# Patient Record
Sex: Female | Born: 1985 | Race: White | Hispanic: No | Marital: Married | Smoking: Never smoker
Health system: Southern US, Community
[De-identification: ages and names within clinical notes are randomized; demographics above are authoritative.]

## PROBLEM LIST (undated history)

## (undated) ENCOUNTER — Inpatient Hospital Stay (HOSPITAL_COMMUNITY): Payer: Self-pay

## (undated) DIAGNOSIS — O139 Gestational [pregnancy-induced] hypertension without significant proteinuria, unspecified trimester: Secondary | ICD-10-CM

---

## 2012-05-06 ENCOUNTER — Encounter: Payer: Self-pay | Admitting: Unknown Physician Specialty

## 2012-05-06 LAB — US OB COMP LESS 14 WKS

## 2012-05-23 ENCOUNTER — Encounter: Payer: Self-pay | Admitting: Physician Assistant

## 2012-05-23 ENCOUNTER — Other Ambulatory Visit (HOSPITAL_COMMUNITY): Payer: Self-pay | Admitting: Physician Assistant

## 2012-05-23 DIAGNOSIS — IMO0001 Reserved for inherently not codable concepts without codable children: Secondary | ICD-10-CM

## 2012-05-23 DIAGNOSIS — R9389 Abnormal findings on diagnostic imaging of other specified body structures: Secondary | ICD-10-CM

## 2012-05-27 ENCOUNTER — Other Ambulatory Visit: Payer: Self-pay

## 2012-05-28 ENCOUNTER — Ambulatory Visit (HOSPITAL_COMMUNITY)
Admission: RE | Admit: 2012-05-28 | Discharge: 2012-05-28 | Disposition: A | Discharge status: Home / Self Care | Payer: PRIVATE HEALTH INSURANCE | Source: Ambulatory Visit | Attending: Physician Assistant | Admitting: Physician Assistant

## 2012-05-28 ENCOUNTER — Encounter (HOSPITAL_COMMUNITY): Payer: Self-pay

## 2012-05-28 DIAGNOSIS — R9389 Abnormal findings on diagnostic imaging of other specified body structures: Secondary | ICD-10-CM

## 2012-05-28 DIAGNOSIS — IMO0001 Reserved for inherently not codable concepts without codable children: Secondary | ICD-10-CM

## 2012-05-28 DIAGNOSIS — O358XX0 Maternal care for other (suspected) fetal abnormality and damage, not applicable or unspecified: Secondary | ICD-10-CM | POA: Insufficient documentation

## 2012-05-28 DIAGNOSIS — O30009 Twin pregnancy, unspecified number of placenta and unspecified number of amniotic sacs, unspecified trimester: Secondary | ICD-10-CM | POA: Insufficient documentation

## 2012-05-28 IMAGING — US US OB COMP LESS 14 WK
1 series · 12 of 28 positions shown · non-contrast
Comparison: none

[Series 1: us ob comp less 14 wk · 0.21mm/px · 12 of 49 slices shown]
[im 2/49]
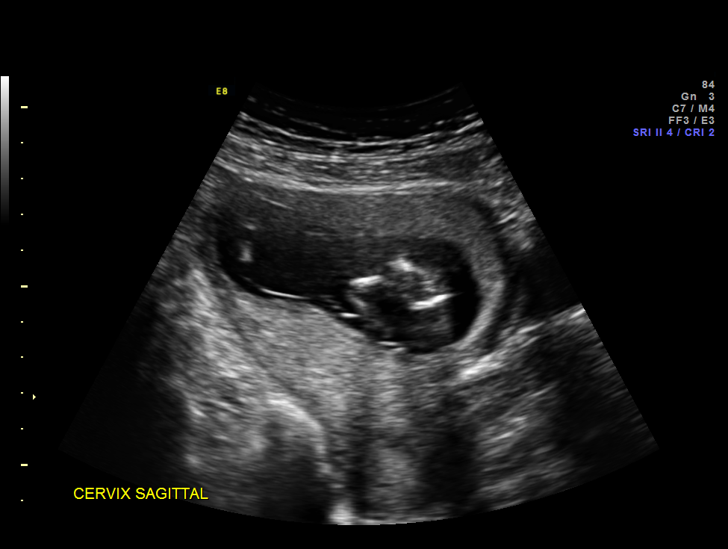
[im 6/49]
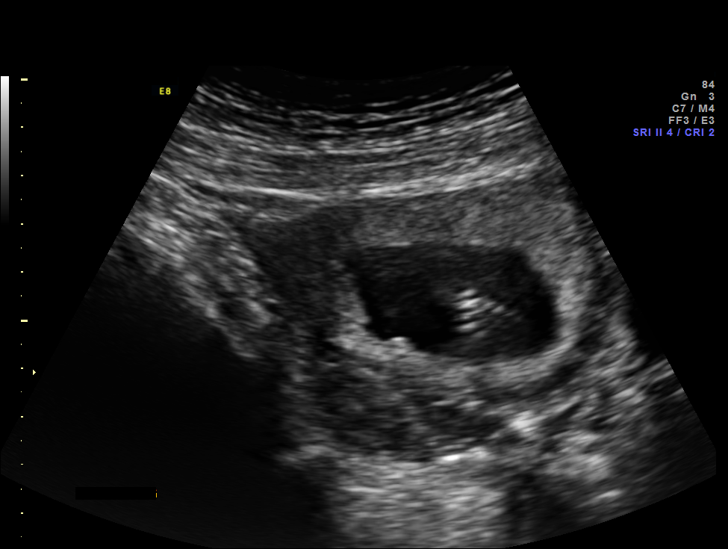
[im 9/49]
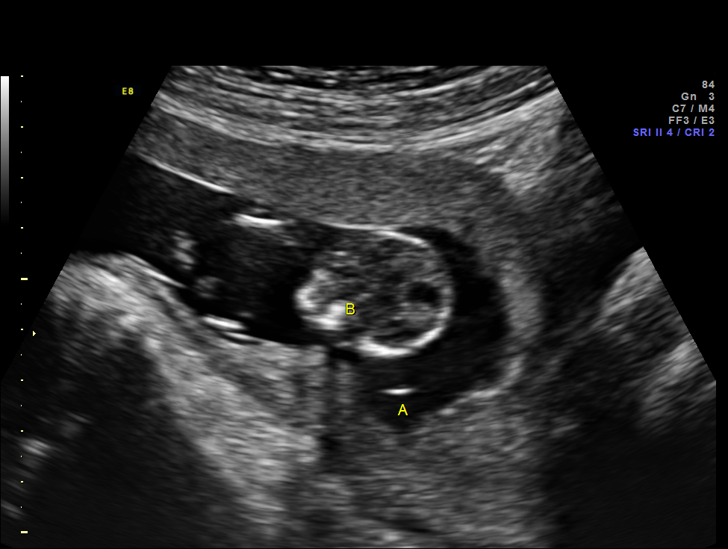
[im 15/49]
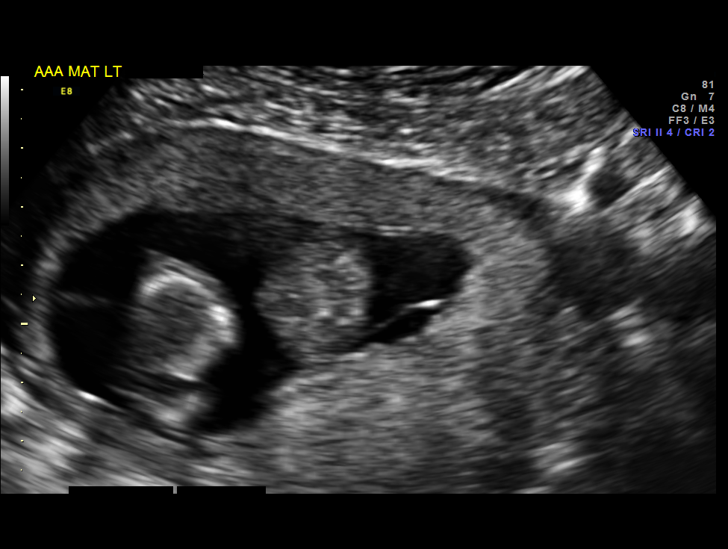
[im 18/49]
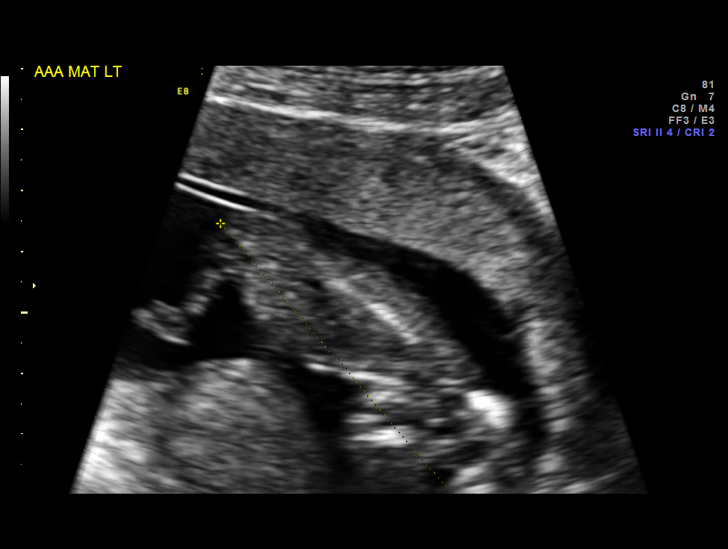
[im 22/49]
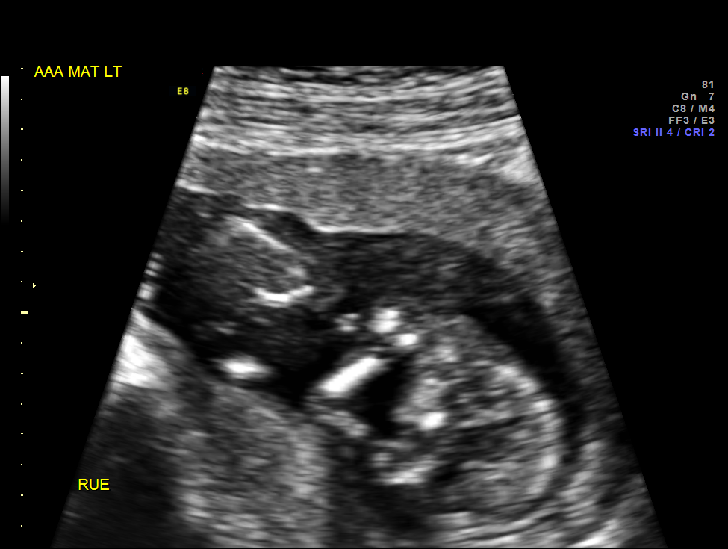
[im 27/49]
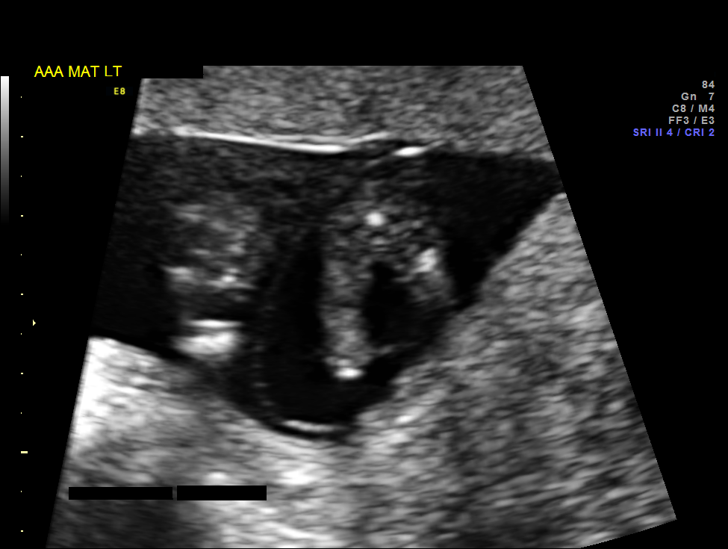
[im 31/49]
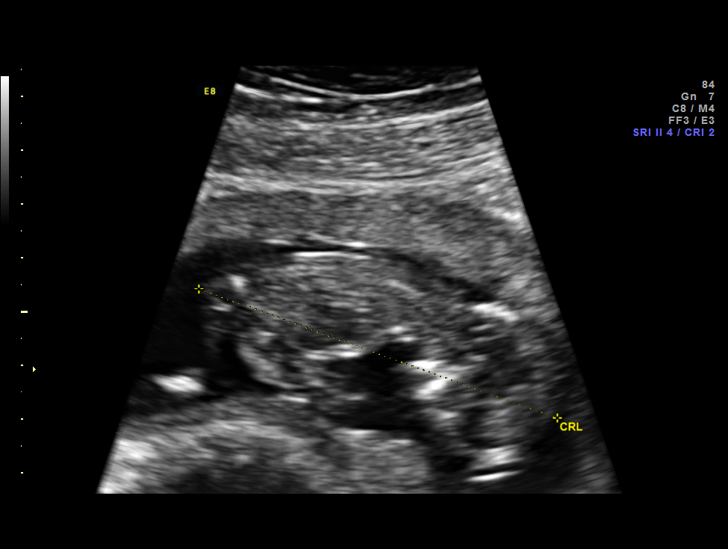
[im 34/49]
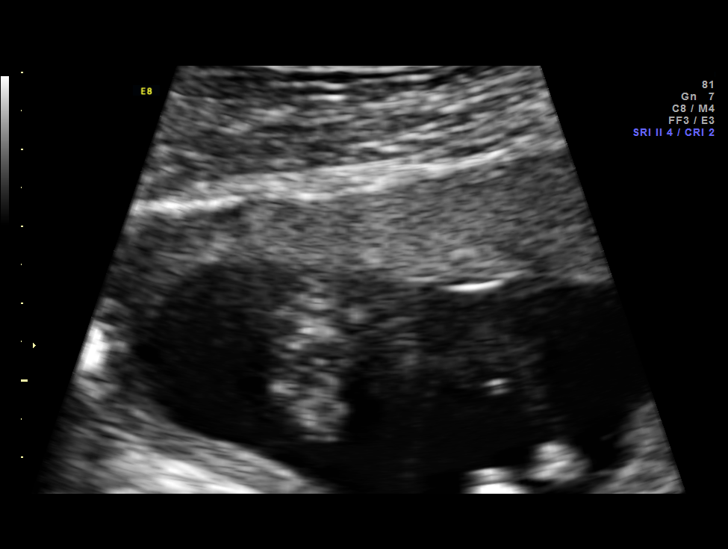
[im 40/49]
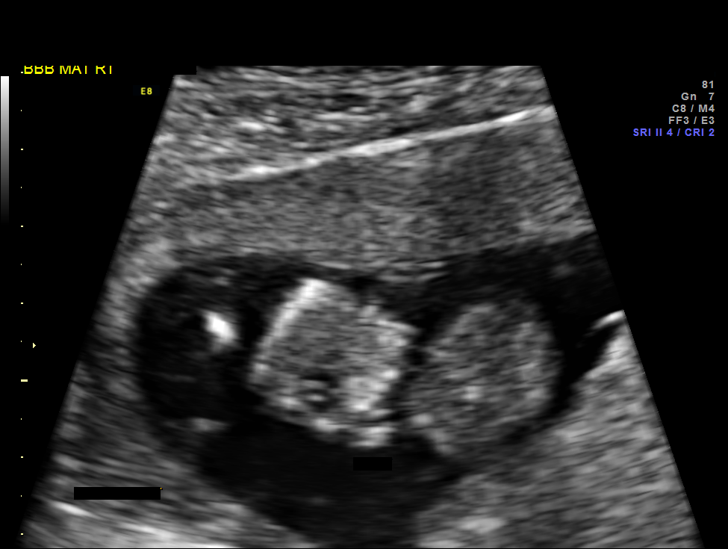
[im 43/49]
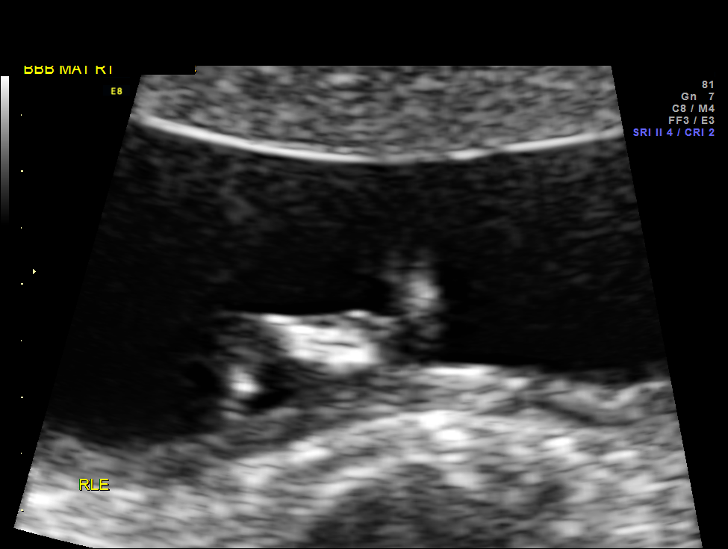
[im 47/49]
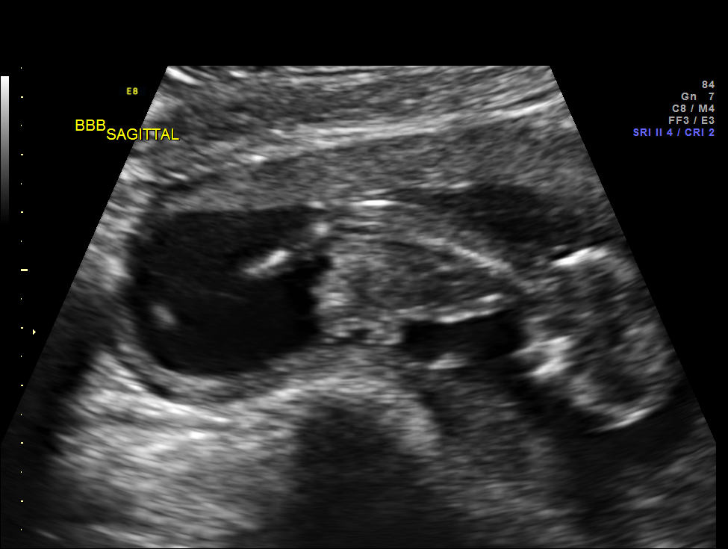

[12 of 28 positions shown; findings below may reference images not displayed]

OBSTETRICS REPORT
                      (Signed Final [DATE] [DATE])

 Order#:         [PHONE_NUMBER]_O,[IG]
                 18_O
Procedures

 US OB COMP LESS 14 WKS                                76801.0
 US OB COMP ADDL GEST LESS 14 WKS                      76802.0
Indications

 Fetal abnormality - other known or suspected
 (question gastroschisis)
 Twin gestation, FG
Fetal Evaluation (Fetus A)

 Preg. Location:    Intrauterine
 Fetal Heart Rate:  157                          bpm
 Cardiac Activity:  Observed
 Fetal Lie:         Maternal left side
 Presentation:      Cephalic
 Placenta:          Anterior, above cervical os
 P. Cord            Eccentric, not marginal
 Insertion:

 Membrane Desc:     Dividing
                    Membrane seen
                    - Monochorionic

 Amniotic Fluid
 AFI FV:      Subjectively within normal limits
Biometry (Fetus A)

 CRL:     68.7  mm     G. Age:  13w 0d                 EDD:    [DATE]
Gestational Age (Fetus A)

 LMP:           13w 0d        Date:  [DATE]                 EDD:   [DATE]
 Best:          13w 0d     Det. By:  LMP  ([DATE])          EDD:   [DATE]
Anatomy (Fetus A)

 Heart:             Situs and axis      Bladder:           Appears normal
                    appear normal
 Stomach:           Appears             Limbs:             Four extremities
                    normal, left                           seen
                    sided
 Abdominal Wall:    Appears nml
                    (cord insert,
                    abd wall)

 Other:     Fetus appears to be a female.

Fetal Evaluation (Fetus B)

 Fetal Heart Rate:  159                          bpm
 Cardiac Activity:  Observed
 Fetal Lie:         Maternal right side
 Presentation:      Cephalic
 Placenta:          Anterior, above cervical os
 P. Cord            Eccentric, not marginal
 Insertion:

 Membrane Desc:     Dividing
                    Membrane seen
                    - Monochorionic

 Amniotic Fluid
 AFI FV:      Subjectively within normal limits
Biometry (Fetus B)

 CRL:     70.7  mm     G. Age:  13w 0d                 EDD:    [DATE]
Gestational Age (Fetus B)

 LMP:           13w 0d        Date:  [DATE]                 EDD:   [DATE]
 Best:          13w 0d     Det. By:  LMP  ([DATE])          EDD:   [DATE]
Anatomy (Fetus B)

 Heart:             Situs and axis      Cord Vessels:      Appears normal
                    appear normal                          (3 vessel cord)
 Stomach:           Appears             Bladder:           Appears normal
                    normal, left
                    sided
 Abdominal Wall:    Gastroschisis       Limbs:             Four extremities
                                                           seen

 Other:     Fetus appears to be a female.
Cervix Uterus Adnexa

 Cervix:       Normal appearance by transabdominal scan.
 Uterus:       No abnormality visualized.
 Cul De Sac:   No free fluid seen.
 Left Ovary:    Within normal limits.
 Right Ovary:   Within normal limits.
 Adnexa:     No abnormality visualized.
Impression

 Monochorionic diamniotic twin pregnancy at 13 weeks 0 days.
 CRL's consistent with EDC based on LMP.
 Gastroschisis of Twin B.
 No other gross anomalies identified in either twin.
Recommendations

 Recommend follow-up ultrasound examination in 4 weeks for
 detailed fetal anatomic survey.

 questions or concerns.
                FG

## 2012-05-29 NOTE — Progress Notes (Signed)
Loretta Ward was seen for ultrasound appointment today.  Please see [facility]OB[facility] report for details.  

## 2012-06-24 ENCOUNTER — Other Ambulatory Visit (HOSPITAL_COMMUNITY): Payer: Self-pay | Admitting: Maternal and Fetal Medicine

## 2012-06-24 DIAGNOSIS — O30009 Twin pregnancy, unspecified number of placenta and unspecified number of amniotic sacs, unspecified trimester: Secondary | ICD-10-CM

## 2012-06-25 ENCOUNTER — Ambulatory Visit (HOSPITAL_COMMUNITY)
Admission: RE | Admit: 2012-06-25 | Discharge: 2012-06-25 | Disposition: A | Discharge status: Home / Self Care | Payer: PRIVATE HEALTH INSURANCE | Source: Ambulatory Visit | Attending: Physician Assistant | Admitting: Physician Assistant

## 2012-06-25 VITALS — BP 121/68 | HR 90 | Wt 172.0 lb

## 2012-06-25 DIAGNOSIS — Z1389 Encounter for screening for other disorder: Secondary | ICD-10-CM | POA: Insufficient documentation

## 2012-06-25 DIAGNOSIS — O358XX0 Maternal care for other (suspected) fetal abnormality and damage, not applicable or unspecified: Secondary | ICD-10-CM | POA: Insufficient documentation

## 2012-06-25 DIAGNOSIS — Z0489 Encounter for examination and observation for other specified reasons: Secondary | ICD-10-CM

## 2012-06-25 DIAGNOSIS — O30009 Twin pregnancy, unspecified number of placenta and unspecified number of amniotic sacs, unspecified trimester: Secondary | ICD-10-CM | POA: Insufficient documentation

## 2012-06-25 DIAGNOSIS — O30039 Twin pregnancy, monochorionic/diamniotic, unspecified trimester: Secondary | ICD-10-CM

## 2012-06-25 DIAGNOSIS — Z363 Encounter for antenatal screening for malformations: Secondary | ICD-10-CM | POA: Insufficient documentation

## 2012-06-26 ENCOUNTER — Other Ambulatory Visit (HOSPITAL_COMMUNITY): Payer: Self-pay | Admitting: Physician Assistant

## 2012-06-26 DIAGNOSIS — IMO0001 Reserved for inherently not codable concepts without codable children: Secondary | ICD-10-CM

## 2012-07-09 ENCOUNTER — Ambulatory Visit (HOSPITAL_COMMUNITY): Admission: RE | Admit: 2012-07-09 | Payer: PRIVATE HEALTH INSURANCE | Source: Ambulatory Visit

## 2012-07-11 ENCOUNTER — Ambulatory Visit (HOSPITAL_COMMUNITY)
Admission: RE | Admit: 2012-07-11 | Discharge: 2012-07-11 | Disposition: A | Discharge status: Home / Self Care | Payer: PRIVATE HEALTH INSURANCE | Source: Ambulatory Visit | Attending: Physician Assistant | Admitting: Physician Assistant

## 2012-07-11 VITALS — BP 124/73 | HR 79 | Wt 176.0 lb

## 2012-07-11 DIAGNOSIS — IMO0001 Reserved for inherently not codable concepts without codable children: Secondary | ICD-10-CM

## 2012-07-11 DIAGNOSIS — O30009 Twin pregnancy, unspecified number of placenta and unspecified number of amniotic sacs, unspecified trimester: Secondary | ICD-10-CM | POA: Insufficient documentation

## 2012-07-11 DIAGNOSIS — O358XX0 Maternal care for other (suspected) fetal abnormality and damage, not applicable or unspecified: Secondary | ICD-10-CM | POA: Insufficient documentation

## 2012-07-11 NOTE — ED Notes (Signed)
Patient states she is feeling some flutters occasionally. She denies any contractions or low back pain.  She did have some bleeding on Wednesday after intercourse, but has stopped now.

## 2012-07-28 ENCOUNTER — Ambulatory Visit (HOSPITAL_COMMUNITY)
Admission: RE | Admit: 2012-07-28 | Discharge: 2012-07-28 | Disposition: A | Discharge status: Home / Self Care | Payer: PRIVATE HEALTH INSURANCE | Source: Ambulatory Visit | Attending: Physician Assistant | Admitting: Physician Assistant

## 2012-07-28 VITALS — BP 121/70 | HR 89 | Wt 181.5 lb

## 2012-07-28 DIAGNOSIS — IMO0001 Reserved for inherently not codable concepts without codable children: Secondary | ICD-10-CM

## 2012-07-28 DIAGNOSIS — O358XX0 Maternal care for other (suspected) fetal abnormality and damage, not applicable or unspecified: Secondary | ICD-10-CM | POA: Insufficient documentation

## 2012-07-28 DIAGNOSIS — O30009 Twin pregnancy, unspecified number of placenta and unspecified number of amniotic sacs, unspecified trimester: Secondary | ICD-10-CM | POA: Insufficient documentation

## 2012-07-28 NOTE — Addendum Note (Signed)
Encounter addended by: Duanne Moron, RN on: 07/28/2012  8:43 AM<BR>     Documentation filed: Charges VN

## 2012-07-28 NOTE — Progress Notes (Signed)
Loretta Ward  was seen today for an ultrasound appointment.  See full report in [facility].  Alpha Gula, MD  Monochorionic/diamniotic twin pregnancy at 21+5 weeks Twin B: gastroschisis Normal amniotic fluid volume x 2; bladders visible in both twins  No evidence of TTTS  Recommend follow-up ultrasound examination in 2 weeks for interval growth. The couple would like to make arrangements to meet with Peds surgery sooner than initially planned. Will make arrangements for outpatient consult.

## 2012-08-11 ENCOUNTER — Ambulatory Visit (HOSPITAL_COMMUNITY)
Admission: RE | Admit: 2012-08-11 | Discharge: 2012-08-11 | Disposition: A | Discharge status: Home / Self Care | Payer: PRIVATE HEALTH INSURANCE | Source: Ambulatory Visit | Attending: Physician Assistant | Admitting: Physician Assistant

## 2012-08-11 ENCOUNTER — Encounter (HOSPITAL_COMMUNITY): Payer: Self-pay

## 2012-08-11 VITALS — BP 112/63 | HR 85 | Wt 176.0 lb

## 2012-08-11 DIAGNOSIS — O30009 Twin pregnancy, unspecified number of placenta and unspecified number of amniotic sacs, unspecified trimester: Secondary | ICD-10-CM | POA: Insufficient documentation

## 2012-08-11 DIAGNOSIS — IMO0001 Reserved for inherently not codable concepts without codable children: Secondary | ICD-10-CM

## 2012-08-11 DIAGNOSIS — O358XX0 Maternal care for other (suspected) fetal abnormality and damage, not applicable or unspecified: Secondary | ICD-10-CM | POA: Insufficient documentation

## 2012-08-11 NOTE — Progress Notes (Signed)
Ms. Loretta Ward had an ultrasound appointment today.  Please see [facility] report for details.  Comments There is an active monochorionic-diamniotic twin pregnancy on today's routine anatomic re-examination.   Twin A: Active fetus The biometry suggests a fetus with an EFW at the approximately 48th percentile for gestational age.  Normal amniotic fluid volume with MVP 3.8cm No dysmorphic features noted on today's images Normally-filled bladder  Twin B: Active fetus with known gastroschisis The biometry suggests a fetus with an EFW at the approximately 35th percentile for gestational age.  Normal amniotic fluid volume with MVP 7.4 Normally-filled bladder  Impressions Active mo-di twin pair Gastroschisis Twin B Concordant growth (8% discordance) Concordant amniotic fluid volume with Twin B approaching upper limit of normal (still <8cm) No evidence of twin-twin transfusion syndrome (no TTTS)  Recommendations 1. Continue to assess interval growth of twins every 3-4 weeks 2. Limited interval ultrasound in 1 week (rather than 2) to assess AFI and for evidence of TTTS given that Twin B's MVP is approaching upper limit of normal; 2. Twice weekly NSTs and weekly AFI beginning at [redacted] weeks gestation 3. Tentativley plan delivery at 37 weeks for this monochorionic twin pair, provided that testing remains reassuring in the interim.  Rogelia Boga, MD, MS, FACOG Assistant Professor Section of Maternal-Fetal Medicine [facility]

## 2012-08-20 ENCOUNTER — Ambulatory Visit (HOSPITAL_COMMUNITY)
Admission: RE | Admit: 2012-08-20 | Discharge: 2012-08-20 | Disposition: A | Discharge status: Home / Self Care | Payer: PRIVATE HEALTH INSURANCE | Source: Ambulatory Visit | Attending: Physician Assistant | Admitting: Physician Assistant

## 2012-08-20 ENCOUNTER — Encounter (HOSPITAL_COMMUNITY): Payer: Self-pay

## 2012-08-20 DIAGNOSIS — O30009 Twin pregnancy, unspecified number of placenta and unspecified number of amniotic sacs, unspecified trimester: Secondary | ICD-10-CM | POA: Insufficient documentation

## 2012-08-20 DIAGNOSIS — IMO0001 Reserved for inherently not codable concepts without codable children: Secondary | ICD-10-CM

## 2012-08-20 DIAGNOSIS — O30039 Twin pregnancy, monochorionic/diamniotic, unspecified trimester: Secondary | ICD-10-CM | POA: Insufficient documentation

## 2012-08-20 DIAGNOSIS — O409XX0 Polyhydramnios, unspecified trimester, not applicable or unspecified: Secondary | ICD-10-CM | POA: Insufficient documentation

## 2012-08-20 DIAGNOSIS — O358XX0 Maternal care for other (suspected) fetal abnormality and damage, not applicable or unspecified: Secondary | ICD-10-CM | POA: Insufficient documentation

## 2012-08-20 NOTE — Progress Notes (Signed)
Ms. Loretta Ward had an ultrasound appointment today.  Please see [facility]OB/GYN report for details.  Comments There is an active monochorionic-diamniotic twin pregnancy on today's routine anatomic re-examination.   Twin A: Active fetus Normal amniotic fluid volume, MVP 5.5 No dysmorphic features noted on today's images Normally-filled bladder  Twin B: Active fetus with known gastroschisis Normal amniotic fluid volume, MVP 5.9 No additional dysmorphic features noted on today's images Normally-filled bladder  Impressions Active mo-di twin pair with concordant growth noted on review of previous biometry (08/11/12) Concordant amniotic fluid volume today No evidence of twin-twin transfusion syndrome (no TTTS)  Recommendations 1. Continue to assess interval growth of twins every 4 weeks with limited interval ultrasounds every 2 weeks to assess AFI and for evidence of TTTS; next growth in 2 weeks (09/03/12) 2. Twice weekly NSTs and weekly AFI beginning at [redacted] weeks gestation 3. Tentativley plan delivery at 37 weeks for this monochorionic twin pair, provided that testing remains reassuring in the interim.  Rogelia Boga, MD, MS, FACOG Assistant Professor Section of Maternal-Fetal Medicine [facility]

## 2012-09-02 ENCOUNTER — Other Ambulatory Visit (HOSPITAL_COMMUNITY): Payer: Self-pay | Admitting: Physician Assistant

## 2012-09-02 DIAGNOSIS — O30009 Twin pregnancy, unspecified number of placenta and unspecified number of amniotic sacs, unspecified trimester: Secondary | ICD-10-CM

## 2012-09-03 ENCOUNTER — Ambulatory Visit (HOSPITAL_COMMUNITY)
Admission: RE | Admit: 2012-09-03 | Discharge: 2012-09-03 | Disposition: A | Discharge status: Home / Self Care | Payer: PRIVATE HEALTH INSURANCE | Source: Ambulatory Visit | Attending: Physician Assistant | Admitting: Physician Assistant

## 2012-09-03 VITALS — BP 110/69 | HR 83 | Wt 195.2 lb

## 2012-09-03 DIAGNOSIS — O30009 Twin pregnancy, unspecified number of placenta and unspecified number of amniotic sacs, unspecified trimester: Secondary | ICD-10-CM | POA: Insufficient documentation

## 2012-09-03 DIAGNOSIS — O358XX0 Maternal care for other (suspected) fetal abnormality and damage, not applicable or unspecified: Secondary | ICD-10-CM | POA: Insufficient documentation

## 2012-09-03 DIAGNOSIS — O30039 Twin pregnancy, monochorionic/diamniotic, unspecified trimester: Secondary | ICD-10-CM

## 2012-09-03 NOTE — Progress Notes (Signed)
Loretta Ward  was seen today for an ultrasound appointment.  See full report in [facility]OB/GYN.  Impression: MC/DA twin gestation with best dates of 27 0/7 weeks Interval growth is appropriate and concordant  Twin A: fetal gastroschisis (previously twin B- now this fetus is the presenting twin)- no dilated loops of bowel noted intra or extra abdominally               transverse presentation with fetal head on the maternal left               Interval growth is appropriate (39th %tile)               Normal amniotic fluid volume               Bladder visible  Twin B: transverse, fetal head to the maternal right               Interval growth is appropriate (47th %tile)               Normal amniotic fluid volume               Normal amniotic fluid volume               Bladder visible - no evidence of TTTS  Recommendations: Recommend follow up in 2 weeks for TTTS screen / fluid check Will make arrangements to transfer care to [facility] - will continue ultrasounds at this facility.  Alpha Gula, MD

## 2012-09-17 ENCOUNTER — Ambulatory Visit (HOSPITAL_COMMUNITY)
Admission: RE | Admit: 2012-09-17 | Discharge: 2012-09-17 | Disposition: A | Discharge status: Home / Self Care | Payer: PRIVATE HEALTH INSURANCE | Source: Ambulatory Visit | Attending: Physician Assistant | Admitting: Physician Assistant

## 2012-09-17 VITALS — BP 120/74 | HR 90 | Wt 202.5 lb

## 2012-09-17 DIAGNOSIS — O30009 Twin pregnancy, unspecified number of placenta and unspecified number of amniotic sacs, unspecified trimester: Secondary | ICD-10-CM | POA: Insufficient documentation

## 2012-09-17 DIAGNOSIS — O358XX0 Maternal care for other (suspected) fetal abnormality and damage, not applicable or unspecified: Secondary | ICD-10-CM | POA: Insufficient documentation

## 2012-09-17 DIAGNOSIS — O30039 Twin pregnancy, monochorionic/diamniotic, unspecified trimester: Secondary | ICD-10-CM | POA: Insufficient documentation

## 2012-09-17 NOTE — Progress Notes (Signed)
Ms. Loretta Ward was seen for ultrasound appointment today.  Please see [facility]-OBGYN report for details.

## 2012-09-18 ENCOUNTER — Other Ambulatory Visit (HOSPITAL_COMMUNITY): Payer: Self-pay | Admitting: Physician Assistant

## 2012-09-18 ENCOUNTER — Other Ambulatory Visit (HOSPITAL_COMMUNITY): Payer: Self-pay | Admitting: Maternal and Fetal Medicine

## 2012-09-18 DIAGNOSIS — O30039 Twin pregnancy, monochorionic/diamniotic, unspecified trimester: Secondary | ICD-10-CM

## 2012-09-29 ENCOUNTER — Other Ambulatory Visit (HOSPITAL_COMMUNITY): Payer: Self-pay | Admitting: Maternal and Fetal Medicine

## 2012-09-29 DIAGNOSIS — O30039 Twin pregnancy, monochorionic/diamniotic, unspecified trimester: Secondary | ICD-10-CM

## 2012-10-01 ENCOUNTER — Inpatient Hospital Stay (HOSPITAL_COMMUNITY): Admission: RE | Admit: 2012-10-01 | Payer: PRIVATE HEALTH INSURANCE | Source: Ambulatory Visit

## 2012-10-01 ENCOUNTER — Ambulatory Visit (HOSPITAL_COMMUNITY)
Admission: RE | Admit: 2012-10-01 | Discharge: 2012-10-01 | Disposition: A | Discharge status: Home / Self Care | Payer: PRIVATE HEALTH INSURANCE | Source: Ambulatory Visit | Attending: Physician Assistant | Admitting: Physician Assistant

## 2012-10-01 ENCOUNTER — Encounter (HOSPITAL_COMMUNITY): Payer: Self-pay

## 2012-10-01 DIAGNOSIS — O365990 Maternal care for other known or suspected poor fetal growth, unspecified trimester, not applicable or unspecified: Secondary | ICD-10-CM | POA: Insufficient documentation

## 2012-10-01 DIAGNOSIS — O30039 Twin pregnancy, monochorionic/diamniotic, unspecified trimester: Secondary | ICD-10-CM

## 2012-10-01 DIAGNOSIS — O30009 Twin pregnancy, unspecified number of placenta and unspecified number of amniotic sacs, unspecified trimester: Secondary | ICD-10-CM | POA: Insufficient documentation

## 2012-10-01 DIAGNOSIS — O358XX0 Maternal care for other (suspected) fetal abnormality and damage, not applicable or unspecified: Secondary | ICD-10-CM | POA: Insufficient documentation

## 2012-10-02 ENCOUNTER — Other Ambulatory Visit (HOSPITAL_COMMUNITY): Payer: Self-pay | Admitting: Physician Assistant

## 2012-10-02 DIAGNOSIS — O30039 Twin pregnancy, monochorionic/diamniotic, unspecified trimester: Secondary | ICD-10-CM

## 2012-10-09 ENCOUNTER — Other Ambulatory Visit (HOSPITAL_COMMUNITY): Payer: Self-pay | Admitting: Physician Assistant

## 2012-10-09 DIAGNOSIS — O30039 Twin pregnancy, monochorionic/diamniotic, unspecified trimester: Secondary | ICD-10-CM

## 2012-10-13 ENCOUNTER — Ambulatory Visit (HOSPITAL_COMMUNITY)
Admission: RE | Admit: 2012-10-13 | Discharge: 2012-10-13 | Disposition: A | Discharge status: Home / Self Care | Payer: PRIVATE HEALTH INSURANCE | Source: Ambulatory Visit | Attending: Obstetrics and Gynecology | Admitting: Obstetrics and Gynecology

## 2012-10-13 VITALS — BP 125/78 | HR 78 | Wt 214.2 lb

## 2012-10-13 DIAGNOSIS — O30039 Twin pregnancy, monochorionic/diamniotic, unspecified trimester: Secondary | ICD-10-CM

## 2012-10-13 DIAGNOSIS — O30009 Twin pregnancy, unspecified number of placenta and unspecified number of amniotic sacs, unspecified trimester: Secondary | ICD-10-CM | POA: Insufficient documentation

## 2012-10-13 DIAGNOSIS — O358XX0 Maternal care for other (suspected) fetal abnormality and damage, not applicable or unspecified: Secondary | ICD-10-CM | POA: Insufficient documentation

## 2012-10-13 DIAGNOSIS — O365990 Maternal care for other known or suspected poor fetal growth, unspecified trimester, not applicable or unspecified: Secondary | ICD-10-CM | POA: Insufficient documentation

## 2012-10-13 NOTE — Progress Notes (Signed)
Loretta Ward  was seen today for an ultrasound appointment.  See full report in [facility].  MC/DA twins with best dates of 32 5/7 weeks Twin B with fetal gastroschisis Active fetus x 2 - BPP 8/8 for both twins Normal amniotic fluid x 2 No evidence of TTTS  Follow up for interval growth / BPP next week Tentatively plan delivery at 36-[redacted] weeks gestation  Alpha Gula, MD

## 2012-10-15 DIAGNOSIS — Q793 Gastroschisis: Secondary | ICD-10-CM

## 2012-10-20 ENCOUNTER — Ambulatory Visit (HOSPITAL_COMMUNITY)
Admission: RE | Admit: 2012-10-20 | Discharge: 2012-10-20 | Disposition: A | Discharge status: Home / Self Care | Payer: PRIVATE HEALTH INSURANCE | Source: Ambulatory Visit | Attending: Physician Assistant | Admitting: Physician Assistant

## 2012-10-20 ENCOUNTER — Other Ambulatory Visit (HOSPITAL_COMMUNITY): Payer: Self-pay | Admitting: Physician Assistant

## 2012-10-20 VITALS — BP 129/76 | HR 86 | Wt 215.5 lb

## 2012-10-20 DIAGNOSIS — O30009 Twin pregnancy, unspecified number of placenta and unspecified number of amniotic sacs, unspecified trimester: Secondary | ICD-10-CM | POA: Insufficient documentation

## 2012-10-20 DIAGNOSIS — O30039 Twin pregnancy, monochorionic/diamniotic, unspecified trimester: Secondary | ICD-10-CM

## 2012-10-20 DIAGNOSIS — O365990 Maternal care for other known or suspected poor fetal growth, unspecified trimester, not applicable or unspecified: Secondary | ICD-10-CM | POA: Insufficient documentation

## 2012-10-20 DIAGNOSIS — O358XX0 Maternal care for other (suspected) fetal abnormality and damage, not applicable or unspecified: Secondary | ICD-10-CM | POA: Insufficient documentation

## 2012-10-20 DIAGNOSIS — Q793 Gastroschisis: Secondary | ICD-10-CM

## 2012-10-20 NOTE — Progress Notes (Signed)
Difficulty tracing babies due to fetal movement.  Unable to differentiate between heart rates at different points.  A- LMQ, B-RUQ.  Will repeat fluid on baby B to possibly obtain 8/8 bpp.

## 2012-10-20 NOTE — Progress Notes (Signed)
[facility]  Indication: 27 yr old G1P0 at [redacted]w[redacted]d with monochorionic/diamniotic twin gestation for follow up ultrasound and biophysical profiles. Twin B with gastroschisis.  Findings: 1. Monochorionic/diamniotic twin gestation; the dividing membrane is seen. 2. Estimated fetal weight for twin A is in the 46th%; twin B is in the 21st%. Twin B has lagging abdominal circumference in the <3rd%. The fetuses are 15% discordant. 3. Anterior placenta without evidence of previa. 4. Normal amniotic fluid volume for both fetuses. 5. Twin B has dilated intraabdominal bowel. 6. The remainder of the limited anatomy survey for both fetuses is normal. Normal stomach and bladder seen in each fetus. 7. Normal umbilical artery Doppler studies in twin B.  8. Normal biophysical profile of 10/10 for twin A; and 8/10 twin B (-2 for nonreactive NST).  Recommendations: 1. Mono/di twin gestation: - previously counseled - appropriate fetal growth - no evidence of twin twin transfusion syndrome; however given that twin B has decreased amniotic fluid volume recommend follow up in 3 days to reassess 2. Twin B with gastroschisis/ lagging abdominal circumference: - previously counseled - has met with Pediatric Surgery - continue to follow fetal growth- recommend repeat in 3 weeks if not delivered 3. Recommend continue antenatal testing - nonstress tests done today; reactive for twin A; nonreactive for twin B - overall BPPs A- 10/10; B 8/10 4. Recommend delivery at 36-[redacted] weeks gestation 5. Follow up in 1 week  Eulis Foster, MD

## 2012-10-21 ENCOUNTER — Ambulatory Visit (HOSPITAL_COMMUNITY): Payer: PRIVATE HEALTH INSURANCE

## 2012-10-24 ENCOUNTER — Ambulatory Visit (HOSPITAL_COMMUNITY)
Admission: RE | Admit: 2012-10-24 | Discharge: 2012-10-24 | Disposition: A | Discharge status: Home / Self Care | Payer: PRIVATE HEALTH INSURANCE | Source: Ambulatory Visit | Attending: Physician Assistant | Admitting: Physician Assistant

## 2012-10-24 ENCOUNTER — Other Ambulatory Visit (HOSPITAL_COMMUNITY): Payer: Self-pay | Admitting: Obstetrics and Gynecology

## 2012-10-24 DIAGNOSIS — Q793 Gastroschisis: Secondary | ICD-10-CM

## 2012-10-24 DIAGNOSIS — O30009 Twin pregnancy, unspecified number of placenta and unspecified number of amniotic sacs, unspecified trimester: Secondary | ICD-10-CM | POA: Insufficient documentation

## 2012-10-24 DIAGNOSIS — O365990 Maternal care for other known or suspected poor fetal growth, unspecified trimester, not applicable or unspecified: Secondary | ICD-10-CM | POA: Insufficient documentation

## 2012-10-24 DIAGNOSIS — O358XX0 Maternal care for other (suspected) fetal abnormality and damage, not applicable or unspecified: Secondary | ICD-10-CM | POA: Insufficient documentation

## 2012-10-24 DIAGNOSIS — O30039 Twin pregnancy, monochorionic/diamniotic, unspecified trimester: Secondary | ICD-10-CM

## 2014-08-16 ENCOUNTER — Encounter (HOSPITAL_COMMUNITY): Payer: Self-pay

## 2015-01-18 ENCOUNTER — Encounter: Payer: Self-pay | Admitting: *Deleted

## 2015-08-22 ENCOUNTER — Encounter: Payer: Self-pay | Admitting: Physician Assistant

## 2015-08-22 ENCOUNTER — Ambulatory Visit: Payer: Self-pay | Admitting: Physician Assistant

## 2015-08-22 VITALS — BP 100/70 | HR 87 | Temp 98.1°F

## 2015-08-22 DIAGNOSIS — J208 Acute bronchitis due to other specified organisms: Secondary | ICD-10-CM

## 2015-08-22 MED ORDER — BENZONATATE 200 MG PO CAPS: 200.0000 mg | ORAL_CAPSULE | Freq: Three times a day (TID) | ORAL | Status: DC | PRN

## 2015-08-22 MED ORDER — METHYLPREDNISOLONE 4 MG PO TBPK: ORAL_TABLET | ORAL | Status: DC

## 2015-08-22 MED ORDER — AZITHROMYCIN 250 MG PO TABS: ORAL_TABLET | ORAL | Status: DC

## 2015-08-22 MED ORDER — ALBUTEROL SULFATE HFA 108 (90 BASE) MCG/ACT IN AERS: 2.0000 | INHALATION_SPRAY | Freq: Four times a day (QID) | RESPIRATORY_TRACT | Status: DC | PRN

## 2015-08-22 NOTE — Progress Notes (Signed)
S: C/o cough and congestion with wheezing for over a week, denies fever, chills,;  mucus is greenish brown in the mornings, then cough is dry and hacking; keeping pt awake at night;  denies cardiac type chest pain or sob, v/d, abd pain Remainder ros neg  O: vitals wnl, nad, tms clear, throat injected, neck supple no lymph, lungs with wheezing in lower lungs fields b/l,  cv rrr, neuro intact  A:  Acute bronchitis   P:  rx medication:  Zpack, tessalon, medrol dose pack, albuterol inhaler; , use otc meds, tylenol or motrin as needed for fever/chills, return if not better in 3 -5 days, return earlier if worsening

## 2015-10-13 ENCOUNTER — Telehealth: Payer: Self-pay | Admitting: Physician Assistant

## 2015-10-13 DIAGNOSIS — R399 Unspecified symptoms and signs involving the genitourinary system: Secondary | ICD-10-CM

## 2015-10-13 MED ORDER — NITROFURANTOIN MONOHYD MACRO 100 MG PO CAPS: 100.0000 mg | ORAL_CAPSULE | Freq: Two times a day (BID) | ORAL | Status: DC

## 2015-10-13 NOTE — Progress Notes (Signed)

## 2015-10-27 ENCOUNTER — Encounter: Payer: Self-pay | Admitting: Physician Assistant

## 2015-10-27 ENCOUNTER — Ambulatory Visit: Payer: Self-pay | Admitting: Physician Assistant

## 2015-10-27 VITALS — BP 122/80 | HR 84 | Temp 97.7°F

## 2015-10-27 DIAGNOSIS — R05 Cough: Secondary | ICD-10-CM

## 2015-10-27 DIAGNOSIS — R059 Cough, unspecified: Secondary | ICD-10-CM

## 2015-10-27 MED ORDER — PREDNISONE 10 MG (21) PO TBPK: 10.0000 mg | ORAL_TABLET | Freq: Every day | ORAL | Status: DC

## 2015-10-27 NOTE — Progress Notes (Signed)
S: C/o runny nose and congestion with dry cough for 3 days, no fever, chills, cp/sob, v/d; mucus is white, cough is sporadic, using tessalon perls without relief  Using otc meds: delsym  O: PE: vitals wnl, nad,  perrl eomi, normocephalic, tms dull, nasal mucosa red and swollen, throat injected, neck supple no lymph, lungs c t a, cv rrr, neuro intact  A:  Acute viral uri   P: sterapred ds 10mg  6 d dose pack; continue inhaler, cough meds; drink fluids, continue regular meds , use otc meds of choice, return if not improving in 5 days, return earlier if worsening

## 2015-11-04 ENCOUNTER — Encounter: Payer: Self-pay | Admitting: Physician Assistant

## 2015-11-04 ENCOUNTER — Ambulatory Visit: Payer: Self-pay | Admitting: Physician Assistant

## 2015-11-04 VITALS — BP 120/80 | HR 60 | Temp 98.1°F

## 2015-11-04 DIAGNOSIS — R059 Cough, unspecified: Secondary | ICD-10-CM

## 2015-11-04 DIAGNOSIS — J209 Acute bronchitis, unspecified: Secondary | ICD-10-CM

## 2015-11-04 DIAGNOSIS — R05 Cough: Secondary | ICD-10-CM

## 2015-11-04 LAB — POCT INFLUENZA A/B
INFLUENZA B, POC: NEGATIVE
Influenza A, POC: NEGATIVE

## 2015-11-04 MED ORDER — HYDROCOD POLST-CPM POLST ER 10-8 MG/5ML PO SUER: 5.0000 mL | Freq: Two times a day (BID) | ORAL | Status: DC | PRN

## 2015-11-04 MED ORDER — LEVOFLOXACIN 500 MG PO TABS: 500.0000 mg | ORAL_TABLET | Freq: Every day | ORAL | Status: DC

## 2015-11-04 NOTE — Progress Notes (Signed)
S: continued cough and congestion, fever last night of about 102, mucus is green/brown, no cp/sob, sx on and off since November, has been on zpack, macrobid for uti, steroids, inhalers, not getting better, kids haven't been sick  O: vitals wnl, pt feels warm to touch, tms dull, nasal mucosa red, throat injected, neck supple no lymph, lungs c t a, cv rrr, cough is dry and hacking, flu swab neg   A: acute bronchitis, sinusitis  P levaquin  qd x 10d, due to recurrent infections, if not better in 1 week order cxr

## 2016-04-03 DIAGNOSIS — H5213 Myopia, bilateral: Secondary | ICD-10-CM | POA: Diagnosis not present

## 2016-04-03 DIAGNOSIS — H52223 Regular astigmatism, bilateral: Secondary | ICD-10-CM | POA: Diagnosis not present

## 2016-04-23 DIAGNOSIS — N760 Acute vaginitis: Secondary | ICD-10-CM | POA: Diagnosis not present

## 2016-07-09 DIAGNOSIS — Z6829 Body mass index (BMI) 29.0-29.9, adult: Secondary | ICD-10-CM | POA: Diagnosis not present

## 2016-07-09 DIAGNOSIS — Z01419 Encounter for gynecological examination (general) (routine) without abnormal findings: Secondary | ICD-10-CM | POA: Diagnosis not present

## 2016-07-09 DIAGNOSIS — N926 Irregular menstruation, unspecified: Secondary | ICD-10-CM | POA: Diagnosis not present

## 2016-07-09 DIAGNOSIS — Z3169 Encounter for other general counseling and advice on procreation: Secondary | ICD-10-CM | POA: Diagnosis not present

## 2016-10-12 ENCOUNTER — Ambulatory Visit: Payer: Self-pay | Admitting: Physician Assistant

## 2016-10-12 ENCOUNTER — Encounter: Payer: Self-pay | Admitting: Physician Assistant

## 2016-10-12 VITALS — BP 119/76 | HR 75 | Temp 97.9°F

## 2016-10-12 DIAGNOSIS — J0120 Acute ethmoidal sinusitis, unspecified: Secondary | ICD-10-CM

## 2016-10-12 MED ORDER — FEXOFENADINE-PSEUDOEPHED ER 60-120 MG PO TB12: 1.0000 | ORAL_TABLET | Freq: Two times a day (BID) | ORAL | 0 refills | Status: DC

## 2016-10-12 MED ORDER — AMOXICILLIN-POT CLAVULANATE 875-125 MG PO TABS: 1.0000 | ORAL_TABLET | Freq: Two times a day (BID) | ORAL | 0 refills | Status: DC

## 2016-10-12 MED ORDER — NAPROXEN 500 MG PO TABS: 500.0000 mg | ORAL_TABLET | Freq: Two times a day (BID) | ORAL | 0 refills | Status: DC

## 2016-10-12 NOTE — Progress Notes (Signed)
   Subjective:    Patient ID: Loretta Ward, female    DOB: April 11, 1986, 30 y.o.   MRN: [id number]  HPI Patient c/o 6 weeks of nasal congestion which worsen yesterday. Also c/o right facial and ear pain.  Denies fever/chill, or N/V/D. No relief with OTC medications.   Review of Systems    Negative except for compliant. Objective:   Physical Exam  Bilateral maxillary guarding. Edematous right TM. Copious post nasal drainage. Neck supple, w/o adenopathy. Lungs CTA and Heart RRR.      Assessment & Plan: Sinusitis  Augmentin, Allerga-D, and Naproxen. Follow up with Family doctor.

## 2016-10-15 NOTE — L&D Delivery Note (Signed)
Delivery Note At 6:24 PM a viable female was delivered via VBAC, Spontaneous (Presentation: ROA).  APGAR: 9, 9; weight pending.  Placenta status: S, I. 3V Cord with the following complications: none.  Cord pH: n/a  Anesthesia:  CLEA Episiotomy: None Lacerations: 2nd degree Suture Repair: 3.0 vicryl rapide Est. Blood Loss (mL):  200  Mom to postpartum.  Baby to Couplet care / Skin to Skin.  Cristie Mckinney 07/31/2017, 6:46 PM

## 2016-10-19 DIAGNOSIS — N979 Female infertility, unspecified: Secondary | ICD-10-CM | POA: Diagnosis not present

## 2016-12-14 DIAGNOSIS — N911 Secondary amenorrhea: Secondary | ICD-10-CM | POA: Diagnosis not present

## 2016-12-18 DIAGNOSIS — Z3481 Encounter for supervision of other normal pregnancy, first trimester: Secondary | ICD-10-CM | POA: Diagnosis not present

## 2016-12-18 DIAGNOSIS — Z13228 Encounter for screening for other metabolic disorders: Secondary | ICD-10-CM | POA: Diagnosis not present

## 2016-12-18 LAB — OB RESULTS CONSOLE GC/CHLAMYDIA
Chlamydia: NEGATIVE
Gonorrhea: NEGATIVE

## 2016-12-18 LAB — OB RESULTS CONSOLE ABO/RH: RH TYPE: POSITIVE

## 2016-12-18 LAB — OB RESULTS CONSOLE HIV ANTIBODY (ROUTINE TESTING): HIV: NONREACTIVE

## 2016-12-18 LAB — OB RESULTS CONSOLE HEPATITIS B SURFACE ANTIGEN: HEP B S AG: NEGATIVE

## 2016-12-18 LAB — OB RESULTS CONSOLE ANTIBODY SCREEN: Antibody Screen: NEGATIVE

## 2016-12-18 LAB — OB RESULTS CONSOLE RPR: RPR: NONREACTIVE

## 2016-12-18 LAB — OB RESULTS CONSOLE RUBELLA ANTIBODY, IGM: Rubella: IMMUNE

## 2016-12-31 DIAGNOSIS — Z3480 Encounter for supervision of other normal pregnancy, unspecified trimester: Secondary | ICD-10-CM | POA: Diagnosis not present

## 2016-12-31 DIAGNOSIS — Z113 Encounter for screening for infections with a predominantly sexual mode of transmission: Secondary | ICD-10-CM | POA: Diagnosis not present

## 2016-12-31 DIAGNOSIS — Z3491 Encounter for supervision of normal pregnancy, unspecified, first trimester: Secondary | ICD-10-CM | POA: Diagnosis not present

## 2017-01-24 DIAGNOSIS — Z36 Encounter for antenatal screening for chromosomal anomalies: Secondary | ICD-10-CM | POA: Diagnosis not present

## 2017-01-24 DIAGNOSIS — Z3491 Encounter for supervision of normal pregnancy, unspecified, first trimester: Secondary | ICD-10-CM | POA: Diagnosis not present

## 2017-01-24 DIAGNOSIS — Z3400 Encounter for supervision of normal first pregnancy, unspecified trimester: Secondary | ICD-10-CM | POA: Diagnosis not present

## 2017-01-24 DIAGNOSIS — N390 Urinary tract infection, site not specified: Secondary | ICD-10-CM | POA: Diagnosis not present

## 2017-01-24 DIAGNOSIS — Z3682 Encounter for antenatal screening for nuchal translucency: Secondary | ICD-10-CM | POA: Diagnosis not present

## 2017-03-04 DIAGNOSIS — Z363 Encounter for antenatal screening for malformations: Secondary | ICD-10-CM | POA: Diagnosis not present

## 2017-03-04 DIAGNOSIS — Z3400 Encounter for supervision of normal first pregnancy, unspecified trimester: Secondary | ICD-10-CM | POA: Diagnosis not present

## 2017-03-04 DIAGNOSIS — Z3A18 18 weeks gestation of pregnancy: Secondary | ICD-10-CM | POA: Diagnosis not present

## 2017-04-01 DIAGNOSIS — Z3400 Encounter for supervision of normal first pregnancy, unspecified trimester: Secondary | ICD-10-CM | POA: Diagnosis not present

## 2017-04-01 DIAGNOSIS — Z362 Encounter for other antenatal screening follow-up: Secondary | ICD-10-CM | POA: Diagnosis not present

## 2017-05-01 DIAGNOSIS — Z3480 Encounter for supervision of other normal pregnancy, unspecified trimester: Secondary | ICD-10-CM | POA: Diagnosis not present

## 2017-05-16 DIAGNOSIS — O99810 Abnormal glucose complicating pregnancy: Secondary | ICD-10-CM | POA: Diagnosis not present

## 2017-06-25 ENCOUNTER — Inpatient Hospital Stay (HOSPITAL_COMMUNITY)
Admission: AD | Admit: 2017-06-25 | Discharge: 2017-06-25 | Disposition: A | Discharge status: Home / Self Care | Payer: 59 | Source: Ambulatory Visit | Attending: Obstetrics and Gynecology | Admitting: Obstetrics and Gynecology

## 2017-06-25 ENCOUNTER — Inpatient Hospital Stay (HOSPITAL_COMMUNITY): Payer: 59

## 2017-06-25 ENCOUNTER — Encounter (HOSPITAL_COMMUNITY): Payer: Self-pay | Admitting: *Deleted

## 2017-06-25 DIAGNOSIS — Z3A33 33 weeks gestation of pregnancy: Secondary | ICD-10-CM

## 2017-06-25 DIAGNOSIS — O368130 Decreased fetal movements, third trimester, not applicable or unspecified: Secondary | ICD-10-CM | POA: Diagnosis not present

## 2017-06-25 DIAGNOSIS — O9A213 Injury, poisoning and certain other consequences of external causes complicating pregnancy, third trimester: Secondary | ICD-10-CM

## 2017-06-25 DIAGNOSIS — Z3A34 34 weeks gestation of pregnancy: Secondary | ICD-10-CM

## 2017-06-25 HISTORY — DX: Gestational (pregnancy-induced) hypertension without significant proteinuria, unspecified trimester: O13.9

## 2017-06-25 LAB — URINALYSIS, ROUTINE W REFLEX MICROSCOPIC
BILIRUBIN URINE: NEGATIVE
GLUCOSE, UA: NEGATIVE mg/dL
KETONES UR: NEGATIVE mg/dL
Nitrite: NEGATIVE
PH: 7 (ref 5.0–8.0)
Protein, ur: NEGATIVE mg/dL
Specific Gravity, Urine: 1.003 — ABNORMAL LOW (ref 1.005–1.030)

## 2017-06-25 NOTE — Discharge Instructions (Signed)
Preventing Injuries During Pregnancy °Trauma is the most common cause of injury and death in pregnant women. This can also result in serious harm to the baby or even death. °How can injuries affect my pregnancy? °Your baby is protected in the womb (uterus) by a sac filled with fluid (amniotic sac). Your baby can be harmed if there is a direct blow to your abdomen and pelvis. Trauma may be caused by: °· Falls. These are more common in the second and third trimester of pregnancy. °· Automobile accidents. °· Domestic violence or assault. °· Severe burns, such as from fire or electricity. ° °These injuries can result in: °· Tearing of your uterus. °· The placenta pulling away from the wall of the uterus (placental abruption). °· The amniotic sac breaking open (rupture of membranes). °· Blockage or decrease in the blood supply to your baby. °· Going into labor earlier than expected. °· Severe injuries to other parts of your body, such as your brain, spine, heart, lungs, or other organs. ° °Minor falls and low-impact automobile accidents do not usually harm your baby, even if they cause a little harm to you. °What can I do to lower my risk? °Safety °· Remove slippery rugs and loose objects on the floor. They increase your risk of tripping or slipping. °· Wear comfortable shoes that have a good grip on the sole. Do not wear high-heeled shoes. °· Always wear your seat belt properly when riding in a car. Use both the lap and shoulder belt, with the lap belt below your abdomen. Always practice safe driving. Do not ride on a motorcycle while pregnant. °Activity °· Avoid walking on wet or slippery floors. °· Do not participate in rough and violent activities or sports. °· Avoid high-risk situations and activities such as: °? Lifting heavy pots of boiling or hot liquids. °? Fixing electrical problems. °? Being near fires or starting fires. °General instructions °· Take over-the-counter and prescription medicines only as told by  your health care provider. °· Know your blood type and the father's blood type in case you develop vaginal bleeding or experience an injury for which a blood transfusion is needed. °· Spousal abuse can be a serious cause of trauma during pregnancy. If you are a victim of domestic violence or assault: °? Call your local emergency services (911 in the [place] °? Contact the National Domestic Violence Hotline for help and support. °When should I seek immediate medical care? °Get help right away if: °· You fall on your abdomen or experience any serious blow to your abdomen. °· You develop stiffness in your neck or pain after a fall or from other trauma. °· You develop a headache or vision problems after a fall or from other trauma. °· You do not feel the baby moving after a fall or trauma, or you feel that the baby is not moving as much as before the fall or trauma. °· You have been the victim of domestic violence or any other kind of physical attack. °· You have been in a car accident. °· You develop vaginal bleeding. °· You have fluid leaking from the vagina. °· You develop uterine contractions. Symptoms include pelvic cramping, pain, or serious low back pain. °· You become weak, faint, or have uncontrolled vomiting after trauma. °· You have a serious burn. This includes burns to the face, neck, hands, or genitals, or burns greater than the size of your palm anywhere else. ° °Summary °· Trauma is the most common cause of   injury and death in pregnant women and can also lead to injury or death of the baby.  Falls, automobile accidents, domestic violence or assault, and severe burns can injure you or your baby. Make sure to get medical help right away if you experience any of these during your pregnancy.  Take steps to prevent slips or falls in your home, such as avoiding slippery floors and removing loose rugs.  Always wear your seat belt properly when riding in a car. Practice safe driving. This information is  not intended to replace advice given to you by your health care provider. Make sure you discuss any questions you have with your health care provider. Document Released: 11/08/2004 Document Revised: 10/10/2016 Document Reviewed: 10/10/2016 Elsevier Interactive Patient Education  2017 [employer]  Fetal Movement Counts Patient Name: ________________________________________________ Patient Due Date: ____________________ What is a fetal movement count? A fetal movement count is the number of times that you feel your baby move during a certain amount of time. This may also be called a fetal kick count. A fetal movement count is recommended for every pregnant woman. You may be asked to start counting fetal movements as early as week 28 of your pregnancy. Pay attention to when your baby is most active. You may notice your baby's sleep and wake cycles. You may also notice things that make your baby move more. You should do a fetal movement count:  When your baby is normally most active.  At the same time each day.  A good time to count movements is while you are resting, after having something to eat and drink. How do I count fetal movements? 1. Find a quiet, comfortable area. Sit, or lie down on your side. 2. Write down the date, the start time and stop time, and the number of movements that you felt between those two times. Take this information with you to your health care visits. 3. For 2 hours, count kicks, flutters, swishes, rolls, and jabs. You should feel at least 10 movements during 2 hours. 4. You may stop counting after you have felt 10 movements. 5. If you do not feel 10 movements in 2 hours, have something to eat and drink. Then, keep resting and counting for 1 hour. If you feel at least 4 movements during that hour, you may stop counting. Contact a health care provider if:  You feel fewer than 4 movements in 2 hours.  Your baby is not moving like he or she usually does. Date:  ____________ Start time: ____________ Stop time: ____________ Movements: ____________ Date: ____________ Start time: ____________ Stop time: ____________ Movements: ____________ Date: ____________ Start time: ____________ Stop time: ____________ Movements: ____________ Date: ____________ Start time: ____________ Stop time: ____________ Movements: ____________ Date: ____________ Start time: ____________ Stop time: ____________ Movements: ____________ Date: ____________ Start time: ____________ Stop time: ____________ Movements: ____________ Date: ____________ Start time: ____________ Stop time: ____________ Movements: ____________ Date: ____________ Start time: ____________ Stop time: ____________ Movements: ____________ Date: ____________ Start time: ____________ Stop time: ____________ Movements: ____________ This information is not intended to replace advice given to you by your health care provider. Make sure you discuss any questions you have with your health care provider. Document Released: 10/31/2006 Document Revised: 05/30/2016 Document Reviewed: 11/10/2015 Elsevier Interactive Patient Education  [employer]2018 [employer]

## 2017-06-25 NOTE — MAU Note (Signed)
Slipped and fell at home, landed on belly.  Baby not moving as much now.  Some low back pain.

## 2017-06-25 NOTE — MAU Provider Note (Signed)
History     CSN: [id number]  Arrival date and time: 06/25/17 1634  First Provider Initiated Contact with Patient 06/25/17 1824      Chief Complaint  Patient presents with  . Fall  . Decreased Fetal Movement   HPI Loretta Ward is a 31 y.o. G2P0102 at [redacted]w[redacted]d who presents s/p fall. Fall occurred at 2 pm today. States she slipped on the floor & landed on the right side of her abdomen. Decreased fetal movement since fall that has returned to normal since arriving to MAU. Denies abdominal pain, vaginal bleeding, or LOF.  OB History    Gravida Para Term Preterm AB Living   SAB TAB Ectopic Multiple Live Births         1        Past Medical History:  Diagnosis Date  . Pregnancy induced hypertension    with first pregnancy    Past Surgical History:  Procedure Laterality Date  . CESAREAN SECTION      No family history on file.  Social History  Substance Use Topics  . Smoking status: Never Smoker  . Smokeless tobacco: Never Used  . Alcohol use No    Allergies: No Known Allergies  Prescriptions Prior to Admission  Medication Sig Dispense Refill Last Dose  . Prenatal Vit-Fe Fumarate-FA (PRENATAL MULTIVITAMIN) TABS tablet Take 1 tablet by mouth daily at 12 noon.   06/25/2017 at Unknown time  . ranitidine (ZANTAC) 150 MG tablet Take 150 mg by mouth daily as needed for heartburn.   Past Week at Unknown time  . amoxicillin-clavulanate (AUGMENTIN) 875-125 MG tablet Take 1 tablet by mouth 2 (two) times daily. (Patient not taking: Reported on 06/25/2017) 20 tablet 0 Not Taking at Unknown time  . fexofenadine-pseudoephedrine (ALLEGRA-D) 60-120 MG 12 hr tablet Take 1 tablet by mouth 2 (two) times daily. (Patient not taking: Reported on 06/25/2017) 20 tablet 0 Not Taking at Unknown time  . naproxen (NAPROSYN) 500 MG tablet Take 1 tablet (500 mg total) by mouth 2 (two) times daily with a meal. (Patient not taking: Reported on 06/25/2017) 20 tablet 0 Not Taking at Unknown time     Review of Systems  Constitutional: Negative.   Gastrointestinal: Negative.   Musculoskeletal: Negative for back pain.   Physical Exam   Blood pressure 109/61, pulse 78, temperature 98.1 F (36.7 C), temperature source Oral, resp. rate 16, weight 210 lb (95.3 kg), SpO2 100 %.  Physical Exam  Nursing note and vitals reviewed. Constitutional: She is oriented to person, place, and time. She appears well-developed and well-nourished. No distress.  HENT:  Head: Normocephalic and atraumatic.  Eyes: Conjunctivae are normal. Right eye exhibits no discharge. Left eye exhibits no discharge. No scleral icterus.  Neck: Normal range of motion.  Respiratory: Effort normal. No respiratory distress.  GI: Soft.  Soft & non tender, no bruising  Neurological: She is alert and oriented to person, place, and time.  Skin: Skin is warm and dry. She is not diaphoretic.  Psychiatric: She has a normal mood and affect. Her behavior is normal. Judgment and thought content normal.   Fetal Tracing:  Baseline: 135 Variability: moderate Accelerations: 15x15 Decelerations: none  Toco: irr ctx  MAU Course  Procedures Results for orders placed or performed during the hospital encounter of 06/25/17 (from the past 24 hour(s))  Urinalysis, Routine w reflex microscopic     Status: Abnormal   Collection Time: 06/25/17  4:30 PM  Result Value Ref Range   Color, Urine YELLOW YELLOW   APPearance CLOUDY (A) CLEAR   Specific Gravity, Urine 1.003 (L) 1.005 - 1.030   pH 7.0 5.0 - 8.0   Glucose, UA NEGATIVE NEGATIVE mg/dL   Hgb urine dipstick SMALL (A) NEGATIVE   Bilirubin Urine NEGATIVE NEGATIVE   Ketones, ur NEGATIVE NEGATIVE mg/dL   Protein, ur NEGATIVE NEGATIVE mg/dL   Nitrite NEGATIVE NEGATIVE   Leukocytes, UA LARGE (A) NEGATIVE   RBC / HPF 6-30 0 - 5 RBC/hpf   WBC, UA TOO NUMEROUS TO COUNT 0 - 5 WBC/hpf   Bacteria, UA MANY (A) NONE SEEN   Squamous Epithelial / LPF 0-5 (A) NONE SEEN   Amorphous  Crystal PRESENT    No results found.  MDM Reactive fetal tracing, Irregular contractions. Abdomen soft, non tender, no bruising. Limited ob ultrasound, no evidence of abruption.  C/w Dr. Marcelle OverlieHolland. Ok to discharge home after 4 hours of monitoring.  Assessment and Plan  A: 1. Traumatic injury during pregnancy in third trimester   2. [redacted] weeks gestation of pregnancy    P; Discharge home Discussed reasons to return to MAU Fetal kick count form F/u with OB  Judeth HornErin Azizi Bally 06/25/2017, 6:24 PM

## 2017-07-08 DIAGNOSIS — Z3480 Encounter for supervision of other normal pregnancy, unspecified trimester: Secondary | ICD-10-CM | POA: Diagnosis not present

## 2017-07-15 DIAGNOSIS — Z3400 Encounter for supervision of normal first pregnancy, unspecified trimester: Secondary | ICD-10-CM | POA: Diagnosis not present

## 2017-07-15 DIAGNOSIS — Z23 Encounter for immunization: Secondary | ICD-10-CM | POA: Diagnosis not present

## 2017-07-22 DIAGNOSIS — O3663X0 Maternal care for excessive fetal growth, third trimester, not applicable or unspecified: Secondary | ICD-10-CM | POA: Diagnosis not present

## 2017-07-22 DIAGNOSIS — Z3A38 38 weeks gestation of pregnancy: Secondary | ICD-10-CM | POA: Diagnosis not present

## 2017-07-22 DIAGNOSIS — Z3400 Encounter for supervision of normal first pregnancy, unspecified trimester: Secondary | ICD-10-CM | POA: Diagnosis not present

## 2017-07-28 LAB — OB RESULTS CONSOLE GBS: GBS: NEGATIVE

## 2017-07-31 ENCOUNTER — Encounter (HOSPITAL_COMMUNITY): Payer: Self-pay

## 2017-07-31 ENCOUNTER — Inpatient Hospital Stay (HOSPITAL_COMMUNITY): Payer: 59 | Admitting: Anesthesiology

## 2017-07-31 ENCOUNTER — Inpatient Hospital Stay (HOSPITAL_COMMUNITY)
Admission: RE | Admit: 2017-07-31 | Discharge: 2017-08-01 | DRG: 807 | Disposition: A | Discharge status: Home / Self Care | Payer: 59 | Source: Ambulatory Visit | Attending: Obstetrics & Gynecology | Admitting: Obstetrics & Gynecology

## 2017-07-31 DIAGNOSIS — Z3490 Encounter for supervision of normal pregnancy, unspecified, unspecified trimester: Secondary | ICD-10-CM

## 2017-07-31 DIAGNOSIS — D649 Anemia, unspecified: Secondary | ICD-10-CM | POA: Diagnosis present

## 2017-07-31 DIAGNOSIS — Z3A39 39 weeks gestation of pregnancy: Secondary | ICD-10-CM | POA: Diagnosis not present

## 2017-07-31 DIAGNOSIS — O34219 Maternal care for unspecified type scar from previous cesarean delivery: Secondary | ICD-10-CM | POA: Diagnosis present

## 2017-07-31 DIAGNOSIS — O9902 Anemia complicating childbirth: Secondary | ICD-10-CM | POA: Diagnosis present

## 2017-07-31 DIAGNOSIS — O34211 Maternal care for low transverse scar from previous cesarean delivery: Secondary | ICD-10-CM | POA: Diagnosis not present

## 2017-07-31 DIAGNOSIS — O30033 Twin pregnancy, monochorionic/diamniotic, third trimester: Secondary | ICD-10-CM | POA: Diagnosis not present

## 2017-07-31 LAB — CBC
HCT: 32.4 % — ABNORMAL LOW (ref 36.0–46.0)
Hemoglobin: 11.5 g/dL — ABNORMAL LOW (ref 12.0–15.0)
MCH: 30.5 pg (ref 26.0–34.0)
MCHC: 35.5 g/dL (ref 30.0–36.0)
MCV: 85.9 fL (ref 78.0–100.0)
Platelets: 188 K/uL (ref 150–400)
RBC: 3.77 MIL/uL — ABNORMAL LOW (ref 3.87–5.11)
RDW: 13.3 % (ref 11.5–15.5)
WBC: 10.7 K/uL — ABNORMAL HIGH (ref 4.0–10.5)

## 2017-07-31 LAB — ABO/RH: ABO/RH(D): A POS

## 2017-07-31 LAB — RPR: RPR Ser Ql: NONREACTIVE

## 2017-07-31 LAB — TYPE AND SCREEN
ABO/RH(D): A POS
Antibody Screen: NEGATIVE

## 2017-07-31 MED ORDER — LACTATED RINGERS IV SOLN
INTRAVENOUS | Status: DC
  Administered 2017-07-31 (×2): via INTRAVENOUS

## 2017-07-31 MED ORDER — LACTATED RINGERS IV SOLN
500.0000 mL | Freq: Once | INTRAVENOUS | Status: AC
  Administered 2017-07-31: 500 mL via INTRAVENOUS

## 2017-07-31 MED ORDER — IBUPROFEN 600 MG PO TABS
600.0000 mg | ORAL_TABLET | Freq: Four times a day (QID) | ORAL | Status: DC
  Administered 2017-08-01 (×3): 600 mg via ORAL
  Filled 2017-07-31 (×2): qty 1

## 2017-07-31 MED ORDER — LIDOCAINE HCL (PF) 1 % IJ SOLN
INTRAMUSCULAR | Status: DC | PRN
  Administered 2017-07-31 (×3): 4 mL via EPIDURAL

## 2017-07-31 MED ORDER — ACETAMINOPHEN 325 MG PO TABS
650.0000 mg | ORAL_TABLET | ORAL | Status: DC | PRN
  Administered 2017-08-01: 650 mg via ORAL
  Filled 2017-07-31: qty 2

## 2017-07-31 MED ORDER — OXYCODONE-ACETAMINOPHEN 5-325 MG PO TABS: 1.0000 | ORAL_TABLET | ORAL | Status: DC | PRN

## 2017-07-31 MED ORDER — EPHEDRINE 5 MG/ML INJ
10.0000 mg | INTRAVENOUS | Status: DC | PRN
  Filled 2017-07-31: qty 2

## 2017-07-31 MED ORDER — FENTANYL 2.5 MCG/ML BUPIVACAINE 1/10 % EPIDURAL INFUSION (WH - ANES)
14.0000 mL/h | INTRAMUSCULAR | Status: DC | PRN
  Administered 2017-07-31: 14 mL/h via EPIDURAL
  Filled 2017-07-31: qty 100

## 2017-07-31 MED ORDER — LIDOCAINE HCL (PF) 1 % IJ SOLN
30.0000 mL | INTRAMUSCULAR | Status: DC | PRN
  Filled 2017-07-31: qty 30

## 2017-07-31 MED ORDER — OXYTOCIN 40 UNITS IN LACTATED RINGERS INFUSION - SIMPLE MED: 2.5000 [IU]/h | INTRAVENOUS | Status: DC

## 2017-07-31 MED ORDER — PHENYLEPHRINE 40 MCG/ML (10ML) SYRINGE FOR IV PUSH (FOR BLOOD PRESSURE SUPPORT)
80.0000 ug | PREFILLED_SYRINGE | INTRAVENOUS | Status: DC | PRN
  Filled 2017-07-31: qty 5

## 2017-07-31 MED ORDER — DIBUCAINE 1 % RE OINT: 1.0000 "application " | TOPICAL_OINTMENT | RECTAL | Status: DC | PRN

## 2017-07-31 MED ORDER — ACETAMINOPHEN 325 MG PO TABS: 650.0000 mg | ORAL_TABLET | ORAL | Status: DC | PRN

## 2017-07-31 MED ORDER — OXYCODONE-ACETAMINOPHEN 5-325 MG PO TABS: 2.0000 | ORAL_TABLET | ORAL | Status: DC | PRN

## 2017-07-31 MED ORDER — PHENYLEPHRINE 40 MCG/ML (10ML) SYRINGE FOR IV PUSH (FOR BLOOD PRESSURE SUPPORT)
80.0000 ug | PREFILLED_SYRINGE | INTRAVENOUS | Status: DC | PRN
  Filled 2017-07-31: qty 5
  Filled 2017-07-31: qty 10

## 2017-07-31 MED ORDER — FENTANYL CITRATE (PF) 100 MCG/2ML IJ SOLN: 50.0000 ug | INTRAMUSCULAR | Status: DC | PRN

## 2017-07-31 MED ORDER — DIPHENHYDRAMINE HCL 50 MG/ML IJ SOLN: 12.5000 mg | INTRAMUSCULAR | Status: DC | PRN

## 2017-07-31 MED ORDER — ZOLPIDEM TARTRATE 5 MG PO TABS: 5.0000 mg | ORAL_TABLET | Freq: Every evening | ORAL | Status: DC | PRN

## 2017-07-31 MED ORDER — OXYTOCIN 40 UNITS IN LACTATED RINGERS INFUSION - SIMPLE MED
1.0000 m[IU]/min | INTRAVENOUS | Status: DC
  Administered 2017-07-31: 1 m[IU]/min via INTRAVENOUS
  Filled 2017-07-31: qty 1000

## 2017-07-31 MED ORDER — ONDANSETRON HCL 4 MG/2ML IJ SOLN: 4.0000 mg | Freq: Four times a day (QID) | INTRAMUSCULAR | Status: DC | PRN

## 2017-07-31 MED ORDER — BENZOCAINE-MENTHOL 20-0.5 % EX AERO
1.0000 "application " | INHALATION_SPRAY | CUTANEOUS | Status: DC | PRN
  Filled 2017-07-31: qty 56

## 2017-07-31 MED ORDER — ONDANSETRON HCL 4 MG PO TABS: 4.0000 mg | ORAL_TABLET | ORAL | Status: DC | PRN

## 2017-07-31 MED ORDER — PRENATAL MULTIVITAMIN CH
1.0000 | ORAL_TABLET | Freq: Every day | ORAL | Status: DC
  Administered 2017-08-01: 1 via ORAL
  Filled 2017-07-31: qty 1

## 2017-07-31 MED ORDER — SIMETHICONE 80 MG PO CHEW: 80.0000 mg | CHEWABLE_TABLET | ORAL | Status: DC | PRN

## 2017-07-31 MED ORDER — OXYTOCIN BOLUS FROM INFUSION
500.0000 mL | Freq: Once | INTRAVENOUS | Status: AC
  Administered 2017-07-31: 500 mL via INTRAVENOUS

## 2017-07-31 MED ORDER — FLEET ENEMA 7-19 GM/118ML RE ENEM: 1.0000 | ENEMA | RECTAL | Status: DC | PRN

## 2017-07-31 MED ORDER — LACTATED RINGERS IV SOLN: 500.0000 mL | Freq: Once | INTRAVENOUS | Status: DC

## 2017-07-31 MED ORDER — SENNOSIDES-DOCUSATE SODIUM 8.6-50 MG PO TABS: 2.0000 | ORAL_TABLET | ORAL | Status: DC

## 2017-07-31 MED ORDER — TERBUTALINE SULFATE 1 MG/ML IJ SOLN
0.2500 mg | Freq: Once | INTRAMUSCULAR | Status: DC | PRN
  Filled 2017-07-31: qty 1

## 2017-07-31 MED ORDER — SOD CITRATE-CITRIC ACID 500-334 MG/5ML PO SOLN: 30.0000 mL | ORAL | Status: DC | PRN

## 2017-07-31 MED ORDER — DIPHENHYDRAMINE HCL 25 MG PO CAPS: 25.0000 mg | ORAL_CAPSULE | Freq: Four times a day (QID) | ORAL | Status: DC | PRN

## 2017-07-31 MED ORDER — ONDANSETRON HCL 4 MG/2ML IJ SOLN: 4.0000 mg | INTRAMUSCULAR | Status: DC | PRN

## 2017-07-31 MED ORDER — COCONUT OIL OIL
1.0000 "application " | TOPICAL_OIL | Status: DC | PRN
  Administered 2017-08-01: 1 via TOPICAL
  Filled 2017-07-31: qty 120

## 2017-07-31 MED ORDER — TETANUS-DIPHTH-ACELL PERTUSSIS 5-2.5-18.5 LF-MCG/0.5 IM SUSP: 0.5000 mL | Freq: Once | INTRAMUSCULAR | Status: DC

## 2017-07-31 MED ORDER — LACTATED RINGERS IV SOLN: 500.0000 mL | INTRAVENOUS | Status: DC | PRN

## 2017-07-31 MED ORDER — WITCH HAZEL-GLYCERIN EX PADS: 1.0000 "application " | MEDICATED_PAD | CUTANEOUS | Status: DC | PRN

## 2017-07-31 NOTE — Anesthesia Procedure Notes (Addendum)
Epidural Patient location during procedure: OB Start time: 07/31/2017 10:27 AM  Staffing Anesthesiologist: Mal AmabileFOSTER, Happy Begeman Performed: anesthesiologist   Preanesthetic Checklist Completed: patient identified, site marked, surgical consent, pre-op evaluation, timeout performed, IV checked, risks and benefits discussed and monitors and equipment checked  Epidural Patient position: sitting Prep: site prepped and draped and DuraPrep Patient monitoring: continuous pulse ox and blood pressure Approach: midline Location: L3-L4 Injection technique: LOR air  Needle:  Needle type: Tuohy  Needle gauge: 17 G Needle length: 9 cm and 9 Needle insertion depth: 7 cm Catheter type: closed end flexible Catheter size: 19 Gauge Catheter at skin depth: 12 cm Test dose: negative and Other  Assessment Events: blood not aspirated, injection not painful, no injection resistance, negative IV test and paresthesia  Additional Notes Patient identified. Risks and benefits discussed including failed block, incomplete  Pain control, post dural puncture headache, nerve damage, paralysis, blood pressure Changes, nausea, vomiting, reactions to medications-both toxic and allergic and post Partum back pain. All questions were answered. Patient expressed understanding and wished to proceed. Sterile technique was used throughout procedure. Epidural site was Dressed with sterile barrier dressing. No paresthesias, signs of intravascular injection Or signs of intrathecal spread were encountered. Attempt x 3. Poor position. Transient paresthesia right leg/buttocks x 2. Needle and catheter redirected. Patient very anxious and moving during proceure. Patient was more comfortable after the epidural was dosed. Please see RN's note for documentation of vital signs and FHR which are stable.

## 2017-07-31 NOTE — Anesthesia Pain Management Evaluation Note (Signed)
  CRNA Pain Management Visit Note  Patient: Loretta Ward, 31 y.o., female  "Hello I am a member of the anesthesia team at [facility]. We have an anesthesia team available at all times to provide care throughout the hospital, including epidural management and anesthesia for C-section. I don't know your plan for the delivery whether it a natural birth, water birth, IV sedation, nitrous supplementation, doula or epidural, but we want to meet your pain goals."   1.Was your pain managed to your expectations on prior hospitalizations?   Yes   2.What is your expectation for pain management during this hospitalization?     Epidural and IV pain meds  3.How can we help you reach that goal? Be available, planning epidural  Record the patient's initial score and the patient's pain goal.   Pain: 2  Pain Goal: 5 [facility] wants you to be able to say your pain was always managed very well.  [facility] HospitalMERRITT,Oracio Galen 07/31/2017

## 2017-07-31 NOTE — Anesthesia Preprocedure Evaluation (Signed)
Anesthesia Evaluation  Patient identified by MRN, date of birth, ID band Patient awake    Reviewed: Allergy & Precautions, Patient's Chart, lab work & pertinent test results  Airway Mallampati: II  TM Distance: >3 FB Neck ROM: Full    Dental no notable dental hx. (+) Teeth Intact   Pulmonary neg pulmonary ROS,    Pulmonary exam normal breath sounds clear to auscultation       Cardiovascular hypertension, Normal cardiovascular exam Rhythm:Regular Rate:Normal     Neuro/Psych negative neurological ROS  negative psych ROS   GI/Hepatic Neg liver ROS, GERD  ,  Endo/Other  Obesity  Renal/GU negative Renal ROS  negative genitourinary   Musculoskeletal   Abdominal (+) + obese,   Peds  Hematology  (+) anemia ,   Anesthesia Other Findings   Reproductive/Obstetrics (+) Pregnancy Previous C/section                             Anesthesia Physical Anesthesia Plan  ASA: II  Anesthesia Plan: Epidural   Post-op Pain Management:    Induction:   PONV Risk Score and Plan:   Airway Management Planned: Natural Airway  Additional Equipment:   Intra-op Plan:   Post-operative Plan:   Informed Consent: I have reviewed the patients History and Physical, chart, labs and discussed the procedure including the risks, benefits and alternatives for the proposed anesthesia with the patient or authorized representative who has indicated his/her understanding and acceptance.   Dental advisory given  Plan Discussed with: Anesthesiologist  Anesthesia Plan Comments:         Anesthesia Quick Evaluation

## 2017-07-31 NOTE — H&P (Signed)
Loretta MilchHeather Ward is a 31 y.o. female presenting for elective IOL/TOLAC; previous C/S for twins.  The patient strongly desires VBAC.  Last pregnancy was complicated by pre-eclampsia; pt has taken daily baby ASA this pregnancy with normal BPs.  GBS negative.  Last u/s one week ago estimated weight 8#11.     OB History    Gravida Para Term Preterm AB Living   2 1   1   2    SAB TAB Ectopic Multiple Live Births         1       Past Medical History:  Diagnosis Date  . Pregnancy induced hypertension    with first pregnancy   Past Surgical History:  Procedure Laterality Date  . CESAREAN SECTION     Family History: family history is not on file. Social History:  reports that she has never smoked. She has never used smokeless tobacco. She reports that she does not drink alcohol or use drugs.     Maternal Diabetes: No Genetic Screening: Normal Maternal Ultrasounds/Referrals: Normal Fetal Ultrasounds or other Referrals:  None Maternal Substance Abuse:  No Significant Maternal Medications:  None Significant Maternal Lab Results:  Lab values include: Group B Strep negative Other Comments:  None  ROS Maternal Medical History:  Fetal activity: Perceived fetal activity is normal.   Last perceived fetal movement was within the past hour.    Prenatal complications: no prenatal complications Prenatal Complications - Diabetes: none.    Dilation: 2 Effacement (%): 70 Station: -3 Exam by:: stone rnc Blood pressure 127/75, pulse 67, temperature 97.8 F (36.6 C), temperature source Oral, resp. rate 18, height 5\' 5"  (1.651 m), weight 213 lb (96.6 kg). Maternal Exam:  Uterine Assessment: Contraction strength is mild.  Contraction frequency is irregular.   Abdomen: Patient reports no abdominal tenderness. Surgical scars: low transverse.   Fundal height is c/w dates.   Estimated fetal weight is 9#.   Fetal presentation: vertex     Physical Exam  Constitutional: She is oriented to person,  place, and time. She appears well-developed and well-nourished.  GI: Soft. There is no rebound and no guarding.  Neurological: She is alert and oriented to person, place, and time.  Skin: Skin is warm and dry.  Psychiatric: She has a normal mood and affect. Her behavior is normal.    Prenatal labs: ABO, Rh:   Antibody:   Rubella:   RPR:    HBsAg:    HIV:    GBS:     Assessment/Plan: 31yo G2P0102 at 2665w4d for IOL/TOLAC -Pitocin 1/1, AROM -Early epidural recommended -Patient is again counseled re: risk of uterine rupture, hemorrhage and maternal and fetal morbidity and mortality.  She understands that given her baby's size, she is at greater risk of C/S as well.  She declines repeat C/S.      Adlai Sinning 07/31/2017, 8:11 AM

## 2017-07-31 NOTE — Progress Notes (Signed)
SVE unchanged: 4/75/-2 IUPC placed Continue pitocin 1/1 to adequacy; max 10 mU.  Mitchel HonourMegan Kailyn Vanderslice, DO

## 2017-08-01 LAB — CBC
HEMATOCRIT: 28.9 % — AB (ref 36.0–46.0)
HEMOGLOBIN: 10.4 g/dL — AB (ref 12.0–15.0)
MCH: 31 pg (ref 26.0–34.0)
MCHC: 36 g/dL (ref 30.0–36.0)
MCV: 86.3 fL (ref 78.0–100.0)
Platelets: 174 10*3/uL (ref 150–400)
RBC: 3.35 MIL/uL — ABNORMAL LOW (ref 3.87–5.11)
RDW: 13.2 % (ref 11.5–15.5)
WBC: 13.6 10*3/uL — AB (ref 4.0–10.5)

## 2017-08-01 NOTE — Lactation Note (Signed)
This note was copied from a baby's chart. Lactation Consultation Note Baby 10 hrs old hasn't been interested in BF. Baby gagging and spitting. Mom has baby elevated in boppy pillow, mom constantly watching baby. Will not rest, states she very tired but can't sleep.  Baby has sleeper and swaddled in blanket w/hat. Encouraged mom to put baby STS to encouraged baby to BF. Encouraged to hand express 5ml flowing colostrum for mom to give colostrum in spoon, mom stated she has done that several times, and baby spit it up. Discussed newborn feeding habits, behavior, STS, I&O, cluster feeding, supply and demand. Mom encouraged to feed baby 8-12 times/24 hours and with feeding cues.  Since baby isn't BF, discouraged circumcision this am. Until baby is BF well.  Mom concerned about not BF , but has hand expressed for stimulation and supplementing. Offered DEBP, mom agreed. Stated as LC setting up DEBP, mom stated she hated pumping but she would. Mom has 145 yr old twins that were 36 weeks and in NICU. Mom used DEBP to supply BM for twins.  Encouraged mom to rest, mom stated she couldn't rest, and worried about baby spitting up. FOB frustrated at mom for not resting.  Mom hot and clammy. Gave mom small fan to cool with.  Encouraged mom to call for assistance or questions.  WH/LC brochure given w/resources, support groups and LC services.  Patient Name: Loretta Ward MilchHeather Fickett ZOXWR'UToday's Date: 08/01/2017 Reason for consult: Initial assessment   Maternal Data Has patient been taught Hand Expression?: Yes Does the patient have breastfeeding experience prior to this delivery?: Yes  Feeding    LATCH Score       Type of Nipple: Everted at rest and after stimulation  Comfort (Breast/Nipple): Soft / non-tender        Interventions Interventions: Breast feeding basics reviewed;Breast compression;DEBP;Breast massage;Position options;Expressed milk;Hand express  Lactation Tools Discussed/Used Pump Review:  Setup, frequency, and cleaning;Milk Storage Initiated by:: Peri JeffersonL. Atalia Litzinger RN IBCLC Date initiated:: 08/01/17   Consult Status Consult Status: Follow-up Date: 08/01/17 Follow-up type: In-patient    Joban Colledge, Diamond NickelLAURA G 08/01/2017, 5:19 AM

## 2017-08-01 NOTE — Anesthesia Postprocedure Evaluation (Signed)
Anesthesia Post Note  Patient: Loretta MilchHeather Hostetler  Procedure(s) Performed: AN AD HOC LABOR EPIDURAL     Patient location during evaluation: Mother Baby Anesthesia Type: Epidural Level of consciousness: awake and alert Pain management: pain level controlled Vital Signs Assessment: post-procedure vital signs reviewed and stable Respiratory status: spontaneous breathing Cardiovascular status: stable Postop Assessment: no backache, no headache, adequate PO intake, patient able to bend at knees, epidural receding and no apparent nausea or vomiting Anesthetic complications: no    Last Vitals:  Vitals:   07/31/17 2120 08/01/17 0200  BP: (!) 119/55 121/69  Pulse: 79 67  Resp: 18 18  Temp: (!) 36.4 C 37.3 C  SpO2:      Last Pain:  Vitals:   08/01/17 0200  TempSrc: Oral  PainSc:    Pain Goal:                 Salome ArntSterling, Kayvan Hoefling Marie

## 2017-08-01 NOTE — Discharge Summary (Signed)
Obstetric Discharge Summary Reason for Admission: induction of labor Prenatal Procedures: none Intrapartum Procedures: spontaneous vaginal delivery Postpartum Procedures: none Complications-Operative and Postpartum: 2nd degree perineal laceration Hemoglobin  Date Value Ref Range Status  08/01/2017 10.4 (L) 12.0 - 15.0 g/dL Final   HCT  Date Value Ref Range Status  08/01/2017 28.9 (L) 36.0 - 46.0 % Final    Physical Exam:  General: alert, cooperative and appears stated age 1Lochia: appropriate Uterine Fundus: firm Incision: healing well, no significant drainage, no dehiscence, no significant erythema DVT Evaluation: No evidence of DVT seen on physical exam. Negative Homan's sign. No cords or calf tenderness. No significant calf/ankle edema.  Discharge Diagnoses: Term Pregnancy-delivered  Discharge Information: Date: 08/01/2017 Activity: pelvic rest Diet: routine Medications: None Condition: stable Instructions: refer to practice specific booklet Discharge to: home   Newborn Data: Live born female  Birth Weight: 8 lb 7.3 oz (3835 g) APGAR: 9, 9  Newborn Delivery   Birth date/time:  07/31/2017 18:24:00 Delivery type:  VBAC, Spontaneous      Home with mother.  Loretta Ward Loretta Ward 08/01/2017, 1:08 PM

## 2017-08-01 NOTE — Lactation Note (Signed)
This note was copied from a baby's chart. Lactation Consultation Note  Patient Name: Loretta Beverly MilchHeather Barz ZOXWR'UToday's Date: 08/01/2017 Reason for consult: Follow-up assessment Baby at 19 hr of life. Upon entry mom reported that baby was having circumcision done. She stated baby will latch but comes off the breast when he starts to suck. Dad stated he sucks his tongue and lips. Mom has manually expressed and spoon fed. Mom stated baby has been "spitting up everything we give him". Mom stated they offered a formula bottle because "he had to eat before they would do his circumcision and we thought a bottle would teach him to suck". Mom stated she would like help latching when baby returns because her goal is to ebf. She is open to trying a NS but does not want to continue pumping. Dad reports they have been doing suck training. Discussed baby behavior, feeding frequency, baby belly size, voids, wt loss, breast changes, and nipple care. Mom will call for latch help at the next bf.     Maternal Data    Feeding Feeding Type: Bottle Fed - Formula Nipple Type: Slow - flow  LATCH Score                   Interventions    Lactation Tools Discussed/Used     Consult Status Consult Status: Follow-up Date: 08/01/17 Follow-up type: In-patient    Rulon Eisenmengerlizabeth E Olivine Hiers 08/01/2017, 1:48 PM

## 2017-08-01 NOTE — Lactation Note (Signed)
This note was copied from a baby's chart. Lactation Consultation Note  Patient Name: Loretta Beverly MilchHeather Kazmierski ZOXWR'UToday's Date: 08/01/2017 Reason for consult: Follow-up assessment Baby at 22 hr of life. RN requested lactation because mom is reporting nipple pain with latch. Mom attempted latch with a #24 NS but reported it pinched too bad to continue the feeding. Baby was able to latch without the NS and mom reported that felt better but was still pinching. When baby came off the breast the 1st time, the nipple was compressed. After repositioning baby in football on the R breast, mom reported the pinching was better but still there, when baby came off the breast the 2nd time, the nipple was normal shaped. Baby has a noticeable lingual frenulum with a postior insertion point. Baby can lift tongue to roof, extend tongue over gum ridge, has some laterization of tongue, can cup tongue, and has a nice gape. When baby first latches or pauses with sucking he will use his gums to hold on to the breast. RN to give coconut oil. Mom to continue with suck training and start tummy time.     Maternal Data    Feeding Feeding Type: Breast Fed Length of feed:  (still latched at exit)  Bjosc LLCATCH Score Latch: Repeated attempts needed to sustain latch, nipple held in mouth throughout feeding, stimulation needed to elicit sucking reflex.  Audible Swallowing: A few with stimulation  Type of Nipple: Everted at rest and after stimulation  Comfort (Breast/Nipple): Filling, red/small blisters or bruises, mild/mod discomfort  Hold (Positioning): Assistance needed to correctly position infant at breast and maintain latch.  LATCH Score: 6  Interventions    Lactation Tools Discussed/Used     Consult Status Consult Status: Follow-up Date: 08/02/17 Follow-up type: In-patient    [name] 08/01/2017, 4:57 PM

## 2017-08-01 NOTE — Progress Notes (Signed)
Post Partum Day 1 Subjective: no complaints, up ad lib, voiding and tolerating PO  Objective: Blood pressure 121/69, pulse 67, temperature 99.1 F (37.3 C), temperature source Oral, resp. rate 18, height 5\' 5"  (1.651 m), weight 96.6 kg (213 lb), SpO2 99 %, unknown if currently breastfeeding.  Physical Exam:  General: alert, cooperative and appears stated age Lochia: appropriate Uterine Fundus: firm Incision: healing well, no significant drainage, no dehiscence, no significant erythema DVT Evaluation: No evidence of DVT seen on physical exam. Negative Homan's sign. No cords or calf tenderness. No significant calf/ankle edema.   Recent Labs  07/31/17 0715 08/01/17 0558  HGB 11.5* 10.4*  HCT 32.4* 28.9*    Assessment/Plan: Circumcision prior to discharge. Baby not feeding well, spitting up, and not ready for circ yet. Mom and dad consented for circ when baby is ready - risks discussed including infxn, bleeding, damage to penis and poor cosmesis.  Patient meeting all pp goals thus far.    LOS: 1 day   Ranae Pilalise Jennifer Rosalita Carey 08/01/2017, 8:45 AM

## 2017-08-07 DIAGNOSIS — Z3483 Encounter for supervision of other normal pregnancy, third trimester: Secondary | ICD-10-CM | POA: Diagnosis not present

## 2017-08-07 DIAGNOSIS — Z3482 Encounter for supervision of other normal pregnancy, second trimester: Secondary | ICD-10-CM | POA: Diagnosis not present

## 2017-08-12 DIAGNOSIS — A599 Trichomoniasis, unspecified: Secondary | ICD-10-CM | POA: Diagnosis not present

## 2017-09-11 DIAGNOSIS — Z1389 Encounter for screening for other disorder: Secondary | ICD-10-CM | POA: Diagnosis not present

## 2017-09-11 DIAGNOSIS — Z113 Encounter for screening for infections with a predominantly sexual mode of transmission: Secondary | ICD-10-CM | POA: Diagnosis not present

## 2017-09-18 DIAGNOSIS — Z30430 Encounter for insertion of intrauterine contraceptive device: Secondary | ICD-10-CM | POA: Diagnosis not present

## 2017-09-19 DIAGNOSIS — H5213 Myopia, bilateral: Secondary | ICD-10-CM | POA: Diagnosis not present

## 2017-09-19 DIAGNOSIS — H52223 Regular astigmatism, bilateral: Secondary | ICD-10-CM | POA: Diagnosis not present

## 2017-11-20 DIAGNOSIS — Z30431 Encounter for routine checking of intrauterine contraceptive device: Secondary | ICD-10-CM | POA: Diagnosis not present

## 2018-03-13 ENCOUNTER — Ambulatory Visit (INDEPENDENT_AMBULATORY_CARE_PROVIDER_SITE_OTHER): Payer: 59 | Admitting: Family Medicine

## 2018-03-13 ENCOUNTER — Encounter: Payer: Self-pay | Admitting: Family Medicine

## 2018-03-13 VITALS — BP 108/72 | Ht 65.5 in | Wt 190.6 lb

## 2018-03-13 DIAGNOSIS — Z0000 Encounter for general adult medical examination without abnormal findings: Secondary | ICD-10-CM | POA: Diagnosis not present

## 2018-03-13 DIAGNOSIS — E611 Iron deficiency: Secondary | ICD-10-CM

## 2018-03-13 LAB — POCT HEMOGLOBIN: Hemoglobin: 12.9 g/dL (ref 12.2–16.2)

## 2018-03-13 NOTE — Progress Notes (Signed)
   Subjective:    Patient ID: Loretta Ward, female    DOB: Dec 28, 1985, 32 y.o.   MRN: [id number]  HPI The patient comes in today for a wellness visit.    A review of their health history was completed.  A review of medications was also completed.  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   Additional concerns: none' pt gets female exam done at [facility] office  Three kids   Married   Nurse for nine yrs  No cancef in the family  Sees dentist twice per month   Sees eye doc yrly for contacts   Flu shot ez yr  zoloft 50 for p p depr, definitely has helped     Exercises three to four times per wk,, [facility], several ties per wk  7 mo old little one      Review of Systems  Constitutional: Negative for activity change, appetite change and fatigue.  HENT: Negative for congestion and rhinorrhea.   Eyes: Negative for discharge.  Respiratory: Negative for cough, chest tightness and wheezing.   Cardiovascular: Negative for chest pain.  Gastrointestinal: Negative for abdominal pain, blood in stool and vomiting.  Endocrine: Negative for polyphagia.  Genitourinary: Negative for difficulty urinating and frequency.  Musculoskeletal: Negative for neck pain.  Skin: Negative for color change.  Allergic/Immunologic: Negative for environmental allergies and food allergies.  Neurological: Negative for weakness and headaches.  Psychiatric/Behavioral: Negative for agitation and behavioral problems.  All other systems reviewed and are negative.      Objective:   Physical Exam  Constitutional: She is oriented to person, place, and time. She appears well-developed and well-nourished.  HENT:  Head: Normocephalic.  Right Ear: External ear normal.  Left Ear: External ear  normal.  Eyes: Pupils are equal, round, and reactive to light.  Neck: Normal range of motion. No thyromegaly present.  Cardiovascular: Normal rate, regular rhythm, normal heart sounds and intact distal pulses.  No murmur heard. Pulmonary/Chest: Effort normal and breath sounds normal. No respiratory distress. She has no wheezes.  Abdominal: Soft. Bowel sounds are normal. She exhibits no distension and no mass. There is no tenderness.  Musculoskeletal: Normal range of motion. She exhibits no edema or tenderness.  Lymphadenopathy:    She has no cervical adenopathy.  Neurological: She is alert and oriented to person, place, and time. She exhibits normal muscle tone.  Skin: Skin is warm and dry.  Psychiatric: She has a normal mood and affect. Her behavior is normal.    Testing patient receives GYN patient continue checkups results elsewhere      Assessment & Plan:  1 impression wellness exam.  Diet discussed.  Exercise discussed.  Mental health discussed.  Patient currently on Zoloft for postpartum depression.  No recent blood work as far as lipid profile however CBC done did reveal anemia.  Follow-up hemoglobin today decent.

## 2018-08-07 DIAGNOSIS — Z113 Encounter for screening for infections with a predominantly sexual mode of transmission: Secondary | ICD-10-CM | POA: Diagnosis not present

## 2018-08-07 DIAGNOSIS — F419 Anxiety disorder, unspecified: Secondary | ICD-10-CM | POA: Diagnosis not present

## 2018-08-07 DIAGNOSIS — N760 Acute vaginitis: Secondary | ICD-10-CM | POA: Diagnosis not present

## 2018-09-24 DIAGNOSIS — H5213 Myopia, bilateral: Secondary | ICD-10-CM | POA: Diagnosis not present

## 2018-09-24 DIAGNOSIS — H52223 Regular astigmatism, bilateral: Secondary | ICD-10-CM | POA: Diagnosis not present

## 2018-12-11 ENCOUNTER — Telehealth: Payer: 59 | Admitting: Physician Assistant

## 2018-12-11 DIAGNOSIS — R6889 Other general symptoms and signs: Secondary | ICD-10-CM

## 2018-12-11 MED ORDER — ALBUTEROL SULFATE 108 (90 BASE) MCG/ACT IN AEPB: 1.0000 | INHALATION_SPRAY | Freq: Four times a day (QID) | RESPIRATORY_TRACT | 0 refills | Status: DC | PRN

## 2018-12-11 MED ORDER — OSELTAMIVIR PHOSPHATE 75 MG PO CAPS: 75.0000 mg | ORAL_CAPSULE | Freq: Two times a day (BID) | ORAL | 0 refills | Status: DC

## 2018-12-11 MED ORDER — BENZONATATE 100 MG PO CAPS: 100.0000 mg | ORAL_CAPSULE | Freq: Three times a day (TID) | ORAL | 0 refills | Status: DC | PRN

## 2018-12-11 NOTE — Progress Notes (Signed)
I have spent five minutes and review of patient's chart, updating chart, medical decision-making and responding to patient.  Piedad Climes, PA-C

## 2018-12-11 NOTE — Progress Notes (Signed)
E visit for Flu like symptoms   We are sorry that you are not feeling well.  Here is how we plan to help! Based on what you have shared with me it looks like you may have a respiratory virus that may be influenza.  Influenza or "the flu" is   an infection caused by a respiratory virus. The flu virus is highly contagious and persons who did not receive their yearly flu vaccination may "catch" the flu from close contact.  We have anti-viral medications to treat the viruses that cause this infection. They are not a "cure" and only shorten the course of the infection. These prescriptions are most effective when they are given within the first 2 days of "flu" symptoms. Antiviral medication are indicated if you have a high risk of complications from the flu. You should  also consider an antiviral medication if you are in close contact with someone who is at risk. These medications can help patients avoid complications from the flu  but have side effects that you should know. Possible side effects from Tamiflu or oseltamivir include nausea, vomiting, diarrhea, dizziness, headaches, eye redness, sleep problems or other respiratory symptoms. You should not take Tamiflu if you have an allergy to oseltamivir or any to the ingredients in Tamiflu.  Based upon your symptoms and potential risk factors I have prescribed Oseltamivir (Tamiflu).  It has been sent to your designated pharmacy.  You will take one 75 mg capsule orally twice a day for the next 5 days. I will also refill your inhaler and am sending in a prescription for tessalon for cough.  ANYONE WHO HAS FLU SYMPTOMS SHOULD: . Stay home. The flu is highly contagious and going out or to work exposes others! . Be sure to drink plenty of fluids. Water is fine as well as fruit juices, sodas and electrolyte beverages. You may want to stay away from caffeine or alcohol. If you are nauseated, try taking small sips of liquids. How do you know if you are getting  enough fluid? Your urine should be a pale yellow or almost colorless. . Get rest. . Taking a steamy shower or using a humidifier may help nasal congestion and ease sore throat pain. Using a saline nasal spray works much the same way. . Cough drops, hard candies and sore throat lozenges may ease your cough. . Line up a caregiver. Have someone check on you regularly.   GET HELP RIGHT AWAY IF: . You cannot keep down liquids or your medications. . You become short of breath . Your fell like you are going to pass out or loose consciousness. . Your symptoms persist after you have completed your treatment plan MAKE SURE YOU   Understand these instructions.  Will watch your condition.  Will get help right away if you are not doing well or get worse.  Your e-visit answers were reviewed by a board certified advanced clinical practitioner to complete your personal care plan.  Depending on the condition, your plan could have included both over the counter or prescription medications.  If there is a problem please reply  once you have received a response from your provider.  Your safety is important to [place].  If you have drug allergies check your prescription carefully.    You can use MyChart to ask questions about today's visit, request a non-urgent call back, or ask for a work or school excuse for 24 hours related to this e-Visit. If it has been greater than  24 hours you will need to follow up with your provider, or enter a new e-Visit to address those concerns.  You will get an e-mail in the next two days asking about your experience.  I hope that your e-visit has been valuable and will speed your recovery. Thank you for using e-visits.

## 2018-12-25 ENCOUNTER — Ambulatory Visit (INDEPENDENT_AMBULATORY_CARE_PROVIDER_SITE_OTHER): Payer: No Typology Code available for payment source | Admitting: Family Medicine

## 2018-12-25 ENCOUNTER — Other Ambulatory Visit: Payer: Self-pay

## 2018-12-25 ENCOUNTER — Encounter: Payer: Self-pay | Admitting: Family Medicine

## 2018-12-25 VITALS — BP 110/80 | Temp 98.3°F | Ht 65.5 in | Wt 191.0 lb

## 2018-12-25 DIAGNOSIS — R21 Rash and other nonspecific skin eruption: Secondary | ICD-10-CM

## 2018-12-25 MED ORDER — DESONIDE 0.05 % EX CREA: TOPICAL_CREAM | Freq: Two times a day (BID) | CUTANEOUS | 0 refills | Status: DC

## 2018-12-25 MED ORDER — MOMETASONE FUROATE 0.1 % EX CREA: 1.0000 "application " | TOPICAL_CREAM | Freq: Two times a day (BID) | CUTANEOUS | 0 refills | Status: DC

## 2018-12-25 NOTE — Progress Notes (Signed)
   Subjective:    Patient ID: Loretta Ward, female    DOB: [birth year], 33 y.o.   MRN: [id number]  HPI Patient is here today with complaints of a rash on legs and arms for the last month. On bilateral lower legs and right forearm. Has been using triamcinolone cream for the last 4 days but not helpful   She states it does not itch.  On going for the last month.  No new potential allergens   Review of Systems  Constitutional: Negative for chills and fever.  Skin: Positive for rash.       Objective:   Physical Exam Vitals signs and nursing note reviewed.  Constitutional:      General: She is not in acute distress.    Appearance: She is well-developed.  HENT:     Head: Normocephalic and atraumatic.  Neck:     Musculoskeletal: Neck supple.  Cardiovascular:     Rate and Rhythm: Normal rate and regular rhythm.     Heart sounds: Normal heart sounds. No murmur.  Pulmonary:     Effort: Pulmonary effort is normal. No respiratory distress.     Breath sounds: Normal breath sounds.  Skin:    General: Skin is warm and dry.     Findings: Rash (scattered areas of discrete small red papules and small areas of scaling noted to bilateral lower extremities and left arm. 2 small areas noted to nose and left cheek) present.  Neurological:     Mental Status: She is alert and oriented to person, place, and time.           Assessment & Plan:  Rash  Recommend referral to dermatology for further evaluation of this rash. Will treat in the meantime with elocon cream to the areas on her arms and legs and desonide to the areas on her face. F/u if symptoms progress and unable to get in with dermatology.   Dr. Lilyan Punt was consulted on this case and is in agreement with the above treatment plan.

## 2018-12-29 ENCOUNTER — Encounter: Payer: Self-pay | Admitting: Family Medicine

## 2019-11-16 ENCOUNTER — Encounter: Payer: Self-pay | Admitting: Family Medicine

## 2020-05-30 ENCOUNTER — Telehealth: Payer: Self-pay | Admitting: Emergency Medicine

## 2020-05-30 DIAGNOSIS — R399 Unspecified symptoms and signs involving the genitourinary system: Secondary | ICD-10-CM

## 2020-05-30 MED ORDER — CEPHALEXIN 500 MG PO CAPS: 500.0000 mg | ORAL_CAPSULE | Freq: Two times a day (BID) | ORAL | 0 refills | Status: AC

## 2020-05-30 NOTE — Progress Notes (Signed)
Time spent: 10 min  We are sorry that you are not feeling well.  Here is how we plan to help!  Based on what you shared with me it looks like you most likely have a simple urinary tract infection.  A UTI (Urinary Tract Infection) is a bacterial infection of the bladder.  Most cases of urinary tract infections are simple to treat but a key part of your care is to encourage you to drink plenty of fluids and watch your symptoms carefully.  I have prescribed Keflex 500 mg twice a day for 7 days.  Your symptoms should gradually improve. Call us or seek in person medical evaluation if the burning in your urine worsens, you develop worsening fever, back pain or pelvic pain or if your symptoms do not resolve after completing the antibiotic.  Symptoms usually start to improve in 24-48 hours of antibiotics.  Urinary tract infections can be prevented by drinking plenty of water to keep your body hydrated.  Also be sure when you wipe, wipe from front to back and don't hold it in!  If possible, empty your bladder every 4 hours.    Over the counter Azo (Phenazopyridine Hydrochloride) can help with burning sensation.  Your e-visit answers were reviewed by a board certified advanced clinical practitioner to complete your personal care plan.  Depending on the condition, your plan could have included both over the counter or prescription medications.  If there is a problem please reply  once you have received a response from your provider.  Your safety is important to [place].  If you have drug allergies check your prescription carefully.    You can use MyChart to ask questions about today's visit, request a non-urgent call back, or ask for a work or school excuse for 24 hours related to this e-Visit. If it has been greater than 24 hours you will need to follow up with your provider, or enter a new e-Visit to address those concerns.   You will get an e-mail in the next two days asking about your experience.  I  hope that your e-visit has been valuable and will speed your recovery. Thank you for using e-visits.  I hope you feel better soon,   Sharen Heck, PA-C [facility]

## 2020-10-18 ENCOUNTER — Other Ambulatory Visit: Payer: Self-pay | Admitting: Emergency Medicine

## 2020-10-18 ENCOUNTER — Telehealth: Payer: No Typology Code available for payment source | Admitting: Emergency Medicine

## 2020-10-18 DIAGNOSIS — R059 Cough, unspecified: Secondary | ICD-10-CM | POA: Diagnosis not present

## 2020-10-18 MED ORDER — PREDNISONE 20 MG PO TABS: 40.0000 mg | ORAL_TABLET | Freq: Every day | ORAL | 0 refills | Status: DC

## 2020-10-18 MED ORDER — BENZONATATE 100 MG PO CAPS: 100.0000 mg | ORAL_CAPSULE | Freq: Two times a day (BID) | ORAL | 0 refills | Status: DC | PRN

## 2020-10-18 MED ORDER — AZITHROMYCIN 250 MG PO TABS: ORAL_TABLET | ORAL | 0 refills | Status: DC

## 2020-10-18 NOTE — Progress Notes (Signed)
We are sorry that you are not feeling well.  Here is how we plan to help!  Based on your presentation I believe you most likely have A cough due to bacteria.  When patients have a fever and a productive cough with a change in color or increased sputum production, we are concerned about bacterial bronchitis.  If left untreated it can progress to pneumonia.  If your symptoms do not improve with your treatment plan it is important that you contact your provider.   I have prescribed Azithromyin 250 mg: two tablets now and then one tablet daily for 4 additonal days    In addition you may use A prescription cough medication called Tessalon Perles 100mg . You may take 1-2 capsules every 8 hours as needed for your cough.  Prednisone  From your responses in the eVisit questionnaire you describe inflammation in the upper respiratory tract which is causing a significant cough.  This is commonly called Bronchitis and has four common causes:    Allergies  Viral Infections  Acid Reflux  Bacterial Infection Allergies, viruses and acid reflux are treated by controlling symptoms or eliminating the cause. An example might be a cough caused by taking certain blood pressure medications. You stop the cough by changing the medication. Another example might be a cough caused by acid reflux. Controlling the reflux helps control the cough.  USE OF BRONCHODILATOR ("RESCUE") INHALERS: There is a risk from using your bronchodilator too frequently.  The risk is that over-reliance on a medication which only relaxes the muscles surrounding the breathing tubes can reduce the effectiveness of medications prescribed to reduce swelling and congestion of the tubes themselves.  Although you feel brief relief from the bronchodilator inhaler, your asthma may actually be worsening with the tubes becoming more swollen and filled with mucus.  This can delay other crucial treatments, such as oral steroid medications. If you need to use a  bronchodilator inhaler daily, several times per day, you should discuss this with your provider.  There are probably better treatments that could be used to keep your asthma under control.     HOME CARE . Only take medications as instructed by your medical team. . Complete the entire course of an antibiotic. . Drink plenty of fluids and get plenty of rest. . Avoid close contacts especially the very young and the elderly . Cover your mouth if you cough or cough into your sleeve. . Always remember to wash your hands . A steam or ultrasonic humidifier can help congestion.   GET HELP RIGHT AWAY IF: . You develop worsening fever. . You become short of breath . You cough up blood. . Your symptoms persist after you have completed your treatment plan MAKE SURE YOU   Understand these instructions.  Will watch your condition.  Will get help right away if you are not doing well or get worse.  Your e-visit answers were reviewed by a board certified advanced clinical practitioner to complete your personal care plan.  Depending on the condition, your plan could have included both over the counter or prescription medications. If there is a problem please reply  once you have received a response from your provider. Your safety is important to .  If you have drug allergies check your prescription carefully.    You can use MyChart to ask questions about today's visit, request a non-urgent call back, or ask for a work or school excuse for 24 hours related to this e-Visit. If it has  been greater than 24 hours you will need to follow up with your provider, or enter a new e-Visit to address those concerns. You will get an e-mail in the next two days asking about your experience.  I hope that your e-visit has been valuable and will speed your recovery. Thank you for using e-visits.  Approximately 5 minutes was used in reviewing the patient's chart, questionnaire, prescribing medications, and  documentation.

## 2020-12-19 ENCOUNTER — Other Ambulatory Visit (HOSPITAL_COMMUNITY): Payer: Self-pay | Admitting: Pharmacist

## 2021-03-13 ENCOUNTER — Telehealth: Payer: No Typology Code available for payment source | Admitting: Physician Assistant

## 2021-03-13 DIAGNOSIS — R058 Other specified cough: Secondary | ICD-10-CM

## 2021-03-13 MED ORDER — PREDNISONE 20 MG PO TABS: 20.0000 mg | ORAL_TABLET | Freq: Every day | ORAL | 0 refills | Status: DC

## 2021-03-13 MED ORDER — BENZONATATE 100 MG PO CAPS: 100.0000 mg | ORAL_CAPSULE | Freq: Three times a day (TID) | ORAL | 0 refills | Status: DC | PRN

## 2021-03-13 NOTE — Progress Notes (Signed)
We are sorry that you are not feeling well.  Here is how we plan to help!  Based on your presentation I believe you most likely have A cough due to a virus.  This is called viral bronchitis and is best treated by rest, plenty of fluids and control of the cough.  You may use Ibuprofen or Tylenol as directed to help your symptoms.  I do feel your symptoms are more consistent with post viral cough syndrome. This is a dry, consistent cough secondary to inflammation from a previous viral infection, such as covid 19 or the flu. Most post viral cough syndrome is self-limited and normally resolves in 4-6 weeks.     In addition you may use A prescription cough medication called Tessalon Perles 100mg . You may take 1-2 capsules every 8 hours as needed for your cough.  I will also prescribe Prednisone 20mg . Take one tablet by mouth daily for 10 days.  From your responses in the eVisit questionnaire you describe inflammation in the upper respiratory tract which is causing a significant cough.  This is commonly called Bronchitis and has four common causes:    Allergies  Viral Infections  Acid Reflux  Bacterial Infection Allergies, viruses and acid reflux are treated by controlling symptoms or eliminating the cause. An example might be a cough caused by taking certain blood pressure medications. You stop the cough by changing the medication. Another example might be a cough caused by acid reflux. Controlling the reflux helps control the cough.  USE OF BRONCHODILATOR ("RESCUE") INHALERS: There is a risk from using your bronchodilator too frequently.  The risk is that over-reliance on a medication which only relaxes the muscles surrounding the breathing tubes can reduce the effectiveness of medications prescribed to reduce swelling and congestion of the tubes themselves.  Although you feel brief relief from the bronchodilator inhaler, your asthma may actually be worsening with the tubes becoming more swollen and  filled with mucus.  This can delay other crucial treatments, such as oral steroid medications. If you need to use a bronchodilator inhaler daily, several times per day, you should discuss this with your provider.  There are probably better treatments that could be used to keep your asthma under control.     HOME CARE . Only take medications as instructed by your medical team. . Complete the entire course of an antibiotic. . Drink plenty of fluids and get plenty of rest. . Avoid close contacts especially the very young and the elderly . Cover your mouth if you cough or cough into your sleeve. . Always remember to wash your hands . A steam or ultrasonic humidifier can help congestion.   GET HELP RIGHT AWAY IF: . You develop worsening fever. . You become short of breath . You cough up blood. . Your symptoms persist after you have completed your treatment plan MAKE SURE YOU   Understand these instructions.  Will watch your condition.  Will get help right away if you are not doing well or get worse.  Your e-visit answers were reviewed by a board certified advanced clinical practitioner to complete your personal care plan.  Depending on the condition, your plan could have included both over the counter or prescription medications. If there is a problem please reply  once you have received a response from your provider. Your safety is important to .  If you have drug allergies check your prescription carefully.    You can use MyChart to ask questions about today's  visit, request a non-urgent call back, or ask for a work or school excuse for 24 hours related to this e-Visit. If it has been greater than 24 hours you will need to follow up with your provider, or enter a new e-Visit to address those concerns. You will get an e-mail in the next two days asking about your experience.  I hope that your e-visit has been valuable and will speed your recovery. Thank you for using e-visits.  I  provided 6 minutes of non face-to-face time during this encounter for chart review and documentation.

## 2021-10-30 ENCOUNTER — Other Ambulatory Visit: Payer: Self-pay

## 2021-10-30 ENCOUNTER — Telehealth: Payer: No Typology Code available for payment source | Admitting: Physician Assistant

## 2021-10-30 DIAGNOSIS — N3090 Cystitis, unspecified without hematuria: Secondary | ICD-10-CM | POA: Diagnosis not present

## 2021-10-30 MED ORDER — CEPHALEXIN 500 MG PO CAPS
500.0000 mg | ORAL_CAPSULE | Freq: Two times a day (BID) | ORAL | 0 refills | Status: AC
  Filled 2021-10-30: qty 14, 7d supply, fill #0

## 2021-10-30 NOTE — Progress Notes (Signed)

## 2021-11-27 ENCOUNTER — Ambulatory Visit: Payer: No Typology Code available for payment source | Admitting: Nurse Practitioner

## 2021-12-13 ENCOUNTER — Other Ambulatory Visit: Payer: Self-pay

## 2021-12-13 MED ORDER — PHENDIMETRAZINE TARTRATE 35 MG PO TABS
ORAL_TABLET | ORAL | 0 refills | Status: DC
  Filled 2021-12-14: qty 90, 30d supply, fill #0

## 2021-12-14 ENCOUNTER — Other Ambulatory Visit: Payer: Self-pay

## 2021-12-15 ENCOUNTER — Other Ambulatory Visit: Payer: Self-pay

## 2022-08-28 ENCOUNTER — Encounter: Payer: Self-pay | Admitting: Family Medicine

## 2022-08-28 ENCOUNTER — Ambulatory Visit (INDEPENDENT_AMBULATORY_CARE_PROVIDER_SITE_OTHER): Payer: No Typology Code available for payment source | Admitting: Family Medicine

## 2022-08-28 ENCOUNTER — Other Ambulatory Visit: Payer: Self-pay

## 2022-08-28 VITALS — BP 112/73 | HR 60 | Ht 65.5 in | Wt 194.6 lb

## 2022-08-28 DIAGNOSIS — F419 Anxiety disorder, unspecified: Secondary | ICD-10-CM | POA: Diagnosis not present

## 2022-08-28 MED ORDER — CLONAZEPAM 0.5 MG PO TABS
0.2500 mg | ORAL_TABLET | Freq: Two times a day (BID) | ORAL | 1 refills | Status: DC | PRN
  Filled 2022-08-28: qty 20, 10d supply, fill #0

## 2022-08-28 MED ORDER — CITALOPRAM HYDROBROMIDE 10 MG PO TABS
10.0000 mg | ORAL_TABLET | Freq: Every day | ORAL | 1 refills | Status: DC
  Filled 2022-08-28: qty 90, 90d supply, fill #0
  Filled 2022-11-26: qty 90, 90d supply, fill #1

## 2022-08-28 NOTE — Progress Notes (Signed)
Subjective:  Patient ID: Loretta Ward, female    DOB: 03/10/1986  Age: 36 y.o. MRN: [id number]  CC: Chief Complaint  Patient presents with   Establish Care    Pt arrives to establish care; former pt of Dr.Steve. pt has been on depression/anxiety meds but has not been taking it in a while; feels she may need it again.     HPI:  36 year old female presents to establish care.  Patient is currently having issues with depression and anxiety.  She works in the ER as a Press photographer.  She states that she has recently had issues in her marriage over the past few months. They are going to counseling.  She is that they are trying to work it out.  She states that she is anxious and easy to anger.  Irritable.  This is worse at home.  She seems to be doing well when she is at work.  She has previously been on medication for depression and anxiety.  She would like to discuss starting medication again.  Patient Active Problem List   Diagnosis Date Noted   Anxiety 08/28/2022    Social Hx   Social History   Socioeconomic History   Marital status: Married    Spouse name: Not on file   Number of children: Not on file   Years of education: Not on file   Highest education level: Not on file  Occupational History   Not on file  Tobacco Use   Smoking status: Never   Smokeless tobacco: Never  Substance and Sexual Activity   Alcohol use: No    Alcohol/week: 0.0 standard drinks of alcohol   Drug use: No   Sexual activity: Yes  Other Topics Concern   Not on file  Social History Narrative   Not on file   Social Determinants of Health   Financial Resource Strain: Not on file  Food Insecurity: Not on file  Transportation Needs: Not on file  Physical Activity: Not on file  Stress: Not on file  Social Connections: Not on file    Review of Systems Per HPI  Objective:  BP 112/73   Pulse 60   Ht 5' 5.5" (1.664 m)   Wt 194 lb 9.6 oz (88.3 kg)   SpO2 100%   BMI 31.89 kg/m       08/28/2022   10:28 AM 12/25/2018    1:42 PM 03/13/2018   10:21 AM  BP/Weight  Systolic BP 112 110   Diastolic BP 73 80   Wt. (Lbs) 194.6 191.01   BMI 31.89 kg/m2 31.3 kg/m2      Information is confidential and restricted. Go to Review Flowsheets to unlock data.    Physical Exam Vitals and nursing note reviewed.  Constitutional:      General: She is not in acute distress.    Appearance: Normal appearance.  HENT:     Head: Normocephalic and atraumatic.  Eyes:     General:        Right eye: No discharge.        Left eye: No discharge.     Conjunctiva/sclera: Conjunctivae normal.  Cardiovascular:     Rate and Rhythm: Normal rate and regular rhythm.  Pulmonary:     Effort: Pulmonary effort is normal.     Breath sounds: Normal breath sounds.  Neurological:     Mental Status: She is alert.  Psychiatric:        Mood and Affect: Mood normal.  Behavior: Behavior normal.     Lab Results  Component Value Date   WBC 13.6 (H) 08/01/2017   HGB 12.9 03/13/2018   HCT 28.9 (L) 08/01/2017   PLT 174 08/01/2017     Assessment & Plan:   Problem List Items Addressed This Visit       Other   Anxiety - Primary    Patient has uncontrolled anxiety.  Likely has some element of depression as well.  Starting on Celexa.  Clonazepam as needed.      Relevant Medications   citalopram (CELEXA) 10 MG tablet    Meds ordered this encounter  Medications   citalopram (CELEXA) 10 MG tablet    Sig: Take 1 tablet (10 mg total) by mouth daily.    Dispense:  90 tablet    Refill:  1   clonazePAM (KLONOPIN) 0.5 MG tablet    Sig: Take 0.5-1 tablets (0.25-0.5 mg total) by mouth 2 (two) times daily as needed for anxiety.    Dispense:  20 tablet    Refill:  1    Follow-up: Patient is going to let me know how she is doing via MyChart in the next 4 to 6 weeks.  Everlene Other DO [facility]

## 2022-08-28 NOTE — Assessment & Plan Note (Signed)
Patient has uncontrolled anxiety.  Likely has some element of depression as well.  Starting on Celexa.  Clonazepam as needed.

## 2022-08-28 NOTE — Patient Instructions (Signed)
Medication as directed.  Let me know how you're doing in 6 weeks.  Take care  Dr. Adriana Simas

## 2022-09-13 ENCOUNTER — Other Ambulatory Visit: Payer: Self-pay

## 2022-09-13 MED ORDER — ONDANSETRON 4 MG PO TBDP
4.0000 mg | ORAL_TABLET | Freq: Four times a day (QID) | ORAL | 1 refills | Status: DC | PRN
  Filled 2022-09-13: qty 20, 5d supply, fill #0

## 2022-09-13 MED ORDER — AZITHROMYCIN 250 MG PO TABS
ORAL_TABLET | ORAL | 0 refills | Status: DC
  Filled 2022-09-13: qty 6, 5d supply, fill #0

## 2022-10-24 ENCOUNTER — Other Ambulatory Visit: Payer: Self-pay

## 2022-10-24 ENCOUNTER — Encounter: Payer: Self-pay | Admitting: Family Medicine

## 2022-10-24 ENCOUNTER — Other Ambulatory Visit: Payer: Self-pay | Admitting: Family Medicine

## 2022-10-24 MED ORDER — ZEPBOUND 2.5 MG/0.5ML ~~LOC~~ SOAJ
2.5000 mg | SUBCUTANEOUS | 0 refills | Status: DC
  Filled 2022-10-24 – 2022-10-30 (×3): qty 2, 28d supply, fill #0

## 2022-10-26 ENCOUNTER — Other Ambulatory Visit: Payer: Self-pay

## 2022-10-30 ENCOUNTER — Other Ambulatory Visit: Payer: Self-pay

## 2022-11-01 ENCOUNTER — Other Ambulatory Visit: Payer: Self-pay | Admitting: Family Medicine

## 2022-11-01 ENCOUNTER — Telehealth: Payer: Self-pay | Admitting: *Deleted

## 2022-11-01 MED ORDER — SAXENDA 18 MG/3ML ~~LOC~~ SOPN
0.6000 mg | PEN_INJECTOR | Freq: Every day | SUBCUTANEOUS | 0 refills | Status: DC
  Filled 2022-11-01: qty 15, 75d supply, fill #0
  Filled 2022-11-09: qty 15, 30d supply, fill #0

## 2022-11-01 MED ORDER — NOVOFINE PLUS PEN NEEDLE 32G X 4 MM MISC
0 refills | Status: DC
  Filled 2022-11-01: qty 100, 90d supply, fill #0

## 2022-11-01 NOTE — Telephone Encounter (Signed)
Coral Spikes, DO     Does she want me to send in N[place] or Saxenda?

## 2022-11-01 NOTE — Telephone Encounter (Signed)
Wegovy in starting dosed is currently unavailable- patient willing to try Saxenda

## 2022-11-01 NOTE — Telephone Encounter (Addendum)
PA for Zepbound denied by insurance. Denial states that Zepbound is only considered when you have previously tried [place] and there is a medical contraindication such as allergy to whey you could not take either  Patient notified and interested in trying the [place] or Wegovy(advised patient that [facility] in starting doses is unavailable currently)  [facility]

## 2022-11-02 ENCOUNTER — Other Ambulatory Visit: Payer: Self-pay

## 2022-11-02 NOTE — Telephone Encounter (Signed)
Coral Spikes, DO     [place] sent.   Patient aware

## 2022-11-09 ENCOUNTER — Telehealth: Payer: Self-pay | Admitting: *Deleted

## 2022-11-09 ENCOUNTER — Other Ambulatory Visit: Payer: Self-pay

## 2022-11-09 NOTE — Telephone Encounter (Signed)
PA for Saxenda approved by insurance. Approval good for 11/07/22-02/27/23  Patient notified

## 2022-11-16 ENCOUNTER — Other Ambulatory Visit: Payer: Self-pay

## 2022-11-20 ENCOUNTER — Other Ambulatory Visit: Payer: Self-pay

## 2022-11-25 ENCOUNTER — Other Ambulatory Visit: Payer: Self-pay

## 2022-11-26 ENCOUNTER — Other Ambulatory Visit: Payer: Self-pay

## 2023-02-19 ENCOUNTER — Telehealth: Payer: Self-pay | Admitting: *Deleted

## 2023-02-19 NOTE — Telephone Encounter (Signed)
Recert for PA for Saxena received by office-current PA expires 02/27/23 - they are requesting current office note and weight to process the PA Patient has not been seen since last PA 08/28/22 and needs current office visit  Left message to return call

## 2023-03-08 ENCOUNTER — Encounter: Payer: Self-pay | Admitting: *Deleted

## 2023-03-08 NOTE — Telephone Encounter (Signed)
Unable to reach patient by phone. Mychart message sent to patient.

## 2023-03-11 ENCOUNTER — Other Ambulatory Visit: Payer: Self-pay | Admitting: Family Medicine

## 2023-03-11 ENCOUNTER — Other Ambulatory Visit: Payer: Self-pay

## 2023-03-12 ENCOUNTER — Other Ambulatory Visit: Payer: Self-pay

## 2023-03-12 MED FILL — Citalopram Hydrobromide Tab 10 MG (Base Equiv): ORAL | 90 days supply | Qty: 90 | Fill #0 | Status: AC

## 2023-05-07 ENCOUNTER — Other Ambulatory Visit: Payer: Self-pay

## 2023-05-07 ENCOUNTER — Ambulatory Visit (INDEPENDENT_AMBULATORY_CARE_PROVIDER_SITE_OTHER): Payer: 59 | Admitting: Family Medicine

## 2023-05-07 VITALS — BP 125/83 | HR 71 | Temp 97.9°F | Ht 64.7 in | Wt 183.2 lb

## 2023-05-07 DIAGNOSIS — F419 Anxiety disorder, unspecified: Secondary | ICD-10-CM

## 2023-05-07 DIAGNOSIS — Z0001 Encounter for general adult medical examination with abnormal findings: Secondary | ICD-10-CM | POA: Diagnosis not present

## 2023-05-07 DIAGNOSIS — Z0000 Encounter for general adult medical examination without abnormal findings: Secondary | ICD-10-CM | POA: Insufficient documentation

## 2023-05-07 DIAGNOSIS — R5383 Other fatigue: Secondary | ICD-10-CM | POA: Diagnosis not present

## 2023-05-07 DIAGNOSIS — Z13220 Encounter for screening for lipoid disorders: Secondary | ICD-10-CM

## 2023-05-07 DIAGNOSIS — Z8759 Personal history of other complications of pregnancy, childbirth and the puerperium: Secondary | ICD-10-CM | POA: Insufficient documentation

## 2023-05-07 MED ORDER — TRAZODONE HCL 50 MG PO TABS
50.0000 mg | ORAL_TABLET | Freq: Every evening | ORAL | 1 refills | Status: DC | PRN
  Filled 2023-05-07: qty 30, 30d supply, fill #0
  Filled 2023-06-07: qty 30, 30d supply, fill #1

## 2023-05-07 NOTE — Assessment & Plan Note (Signed)
Overall doing well excluding sleep/fatigue.  Discussed preventative health care.

## 2023-05-07 NOTE — Assessment & Plan Note (Signed)
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  Obtaining labs for further workup.  Trial of trazodone for insomnia.

## 2023-05-07 NOTE — Progress Notes (Signed)
Subjective:  Patient ID: Loretta Ward, female    DOB: [birth year]  Age: 37 y.o. MRN: [id number]  CC: Physical  HPI:  37 year old female presents for physical exam.  Patient overall is doing well.  Anxiety has improved after divorce.  Continues on Celexa.  Uses clonazepam as needed.  Patient reports that she is having ongoing fatigue.  She is having difficulty sleeping.  Difficulty maintaining sleep.  Wakes up in the melanite and cannot go back to sleep.  She is also working some night shifts which also to resolve her sleep cycle.  Preventative health care is essentially up-to-date.  We did discuss COVID-vaccine and tetanus if she desires.  Patient Active Problem List   Diagnosis Date Noted   History of gestational hypertension 05/07/2023   Annual physical exam 05/07/2023   Fatigue 05/07/2023   Anxiety 08/28/2022    Social Hx   Social History   Socioeconomic History   Marital status: Married    Spouse name: Not on file   Number of children: Not on file   Years of education: Not on file   Highest education level: Not on file  Occupational History   Not on file  Tobacco Use   Smoking status: Never   Smokeless tobacco: Never  Substance and Sexual Activity   Alcohol use: No    Alcohol/week: 0.0 standard drinks of alcohol   Drug use: No   Sexual activity: Yes  Other Topics Concern   Not on file  Social History Narrative   Not on file   Social Determinants of Health   Financial Resource Strain: Not on file  Food Insecurity: Not on file  Transportation Needs: Not on file  Physical Activity: Not on file  Stress: Not on file  Social Connections: Not on file    Review of Systems Per HPI  Objective:  BP 125/83   Pulse 71   Temp 97.9 F (36.6 C)   Ht 5' 4.7" (1.643 m)   Wt 183 lb 3.2 oz (83.1 kg)   SpO2 98%   BMI 30.77 kg/m      05/07/2023    8:50 AM 08/28/2022   10:28 AM 12/25/2018    1:42 PM  BP/Weight  Systolic BP 125 112 110  Diastolic BP 83 73 80   Wt. (Lbs) 183.2 194.6 191.01  BMI 30.77 kg/m2 31.89 kg/m2 31.3 kg/m2    Physical Exam Vitals and nursing note reviewed.  Constitutional:      General: She is not in acute distress.    Appearance: Normal appearance.  HENT:     Head: Normocephalic and atraumatic.     Right Ear: Tympanic membrane normal.     Left Ear: Tympanic membrane normal.     Mouth/Throat:     Pharynx: Oropharynx is clear.  Eyes:     General:        Right eye: No discharge.        Left eye: No discharge.     Conjunctiva/sclera: Conjunctivae normal.  Cardiovascular:     Rate and Rhythm: Normal rate and regular rhythm.  Pulmonary:     Effort: Pulmonary effort is normal.     Breath sounds: Normal breath sounds. No wheezing, rhonchi or rales.  Abdominal:     General: There is no distension.     Palpations: Abdomen is soft.     Tenderness: There is no abdominal tenderness.  Musculoskeletal:     Cervical back: Neck supple.  Neurological:     General: No  focal deficit present.     Mental Status: She is alert.  Psychiatric:        Mood and Affect: Mood normal.        Behavior: Behavior normal.    Lab Results  Component Value Date   WBC 13.6 (H) 08/01/2017   HGB 12.9 03/13/2018   HCT 28.9 (L) 08/01/2017   PLT 174 08/01/2017     Assessment & Plan:   Problem List Items Addressed This Visit       Other   Annual physical exam - Primary    Overall doing well excluding sleep/fatigue.  Discussed preventative health care.       Anxiety    Stable.  Continue Celexa and as needed clonazepam.      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  Obtaining labs for further workup.  Trial of trazodone for insomnia.      Relevant Orders   CBC   TSH   History of gestational hypertension   Relevant Orders   CMP14+EGFR   Other Visit Diagnoses     Screening for lipid disorders       Relevant Orders   Lipid panel        Meds ordered this encounter  Medications   traZODone (DESYREL) 50 MG tablet    Sig: Take 1 tablet (50 mg total) by mouth at bedtime as needed for sleep.    Dispense:  30 tablet    Refill:  1    Follow-up:  Return in about 6 months (around 11/07/2023).  Everlene Other DO [facility]

## 2023-05-07 NOTE — Assessment & Plan Note (Signed)
Stable.  Continue Celexa and as needed clonazepam.

## 2023-05-07 NOTE — Patient Instructions (Signed)
Labs ordered.  Try the Trazodone.  Follow up in 6 months. Call with concerns.

## 2023-05-08 LAB — LIPID PANEL
Chol/HDL Ratio: 2.2 ratio (ref 0.0–4.4)
Cholesterol, Total: 132 mg/dL (ref 100–199)
HDL: 60 mg/dL (ref >39)
LDL Chol Calc (NIH): 53 mg/dL (ref 0–99)
Triglycerides: 104 mg/dL (ref 0–149)
VLDL Cholesterol Cal: 19 mg/dL (ref 5–40)

## 2023-05-08 LAB — CMP14+EGFR
ALT: 21 IU/L (ref 0–32)
AST: 19 IU/L (ref 0–40)
Albumin: 4.4 g/dL (ref 3.9–4.9)
Alkaline Phosphatase: 97 IU/L (ref 44–121)
BUN/Creatinine Ratio: 14 (ref 9–23)
BUN: 11 mg/dL (ref 6–20)
Bilirubin Total: <0.2 mg/dL (ref 0.0–1.2)
CO2: 23 mmol/L (ref 20–29)
Calcium: 10 mg/dL (ref 8.7–10.2)
Chloride: 104 mmol/L (ref 96–106)
Creatinine, Ser: 0.8 mg/dL (ref 0.57–1.00)
Globulin, Total: 2 g/dL (ref 1.5–4.5)
Glucose: 85 mg/dL (ref 70–99)
Potassium: 4.1 mmol/L (ref 3.5–5.2)
Sodium: 140 mmol/L (ref 134–144)
Total Protein: 6.4 g/dL (ref 6.0–8.5)
eGFR: 98 mL/min/{1.73_m2} (ref >59)

## 2023-05-08 LAB — CBC
Hematocrit: 40.8 % (ref 34.0–46.6)
Hemoglobin: 13.9 g/dL (ref 11.1–15.9)
MCH: 30.3 pg (ref 26.6–33.0)
MCHC: 34.1 g/dL (ref 31.5–35.7)
MCV: 89 fL (ref 79–97)
Platelets: 315 10*3/uL (ref 150–450)
RBC: 4.59 x10E6/uL (ref 3.77–5.28)
RDW: 12.3 % (ref 11.7–15.4)
WBC: 10.8 10*3/uL (ref 3.4–10.8)

## 2023-05-08 LAB — TSH: TSH: 1.3 u[IU]/mL (ref 0.450–4.500)

## 2023-05-15 ENCOUNTER — Other Ambulatory Visit: Payer: Self-pay

## 2023-05-15 ENCOUNTER — Telehealth: Payer: 59 | Admitting: Family

## 2023-05-15 DIAGNOSIS — B9689 Other specified bacterial agents as the cause of diseases classified elsewhere: Secondary | ICD-10-CM | POA: Diagnosis not present

## 2023-05-15 DIAGNOSIS — J208 Acute bronchitis due to other specified organisms: Secondary | ICD-10-CM

## 2023-05-15 MED ORDER — PREDNISONE 10 MG PO TABS
ORAL_TABLET | ORAL | 0 refills | Status: DC
  Filled 2023-05-15 (×2): qty 21, 6d supply, fill #0

## 2023-05-15 MED ORDER — BENZONATATE 100 MG PO CAPS
100.0000 mg | ORAL_CAPSULE | Freq: Three times a day (TID) | ORAL | 0 refills | Status: DC | PRN
  Filled 2023-05-15: qty 20, 7d supply, fill #0

## 2023-05-15 MED ORDER — ALBUTEROL SULFATE HFA 108 (90 BASE) MCG/ACT IN AERS
2.0000 | INHALATION_SPRAY | Freq: Four times a day (QID) | RESPIRATORY_TRACT | 0 refills | Status: AC | PRN
  Filled 2023-05-15 (×2): qty 6.7, 25d supply, fill #0

## 2023-05-15 MED ORDER — AZITHROMYCIN 250 MG PO TABS
ORAL_TABLET | ORAL | 0 refills | Status: DC
  Filled 2023-05-15 (×2): qty 6, 5d supply, fill #0

## 2023-05-15 NOTE — Progress Notes (Signed)
E-Visit for Cough  We are sorry that you are not feeling well.  Here is how we plan to help!  Based on your presentation I believe you most likely have A cough due to bacteria.  When patients have a fever and a productive cough with a change in color or increased sputum production, we are concerned about bacterial bronchitis.  If left untreated it can progress to pneumonia.  If your symptoms do not improve with your treatment plan it is important that you contact your provider.   I have prescribed Azithromyin 250 mg: two tablets now and then one tablet daily for 4 additonal days    In addition you may use A non-prescription cough medication called Robitussin DAC. Take 2 teaspoons every 8 hours or Delsym: take 2 teaspoons every 12 hours., A non-prescription cough medication called Mucinex DM: take 2 tablets every 12 hours., and A prescription cough medication called Tessalon Perles 100mg . You may take 1-2 capsules every 8 hours as needed for your cough. I have also sent in albuterol inhaler for you.   Prednisone 10 mg daily for 6 days (see taper instructions below)  Directions for 6 day taper: Day 1: 2 tablets before breakfast, 1 after both lunch & dinner and 2 at bedtime Day 2: 1 tab before breakfast, 1 after both lunch & dinner and 2 at bedtime Day 3: 1 tab at each meal & 1 at bedtime Day 4: 1 tab at breakfast, 1 at lunch, 1 at bedtime Day 5: 1 tab at breakfast & 1 tab at bedtime Day 6: 1 tab at breakfast  From your responses in the eVisit questionnaire you describe inflammation in the upper respiratory tract which is causing a significant cough.  This is commonly called Bronchitis and has four common causes:   Allergies Viral Infections Acid Reflux Bacterial Infection Allergies, viruses and acid reflux are treated by controlling symptoms or eliminating the cause. An example might be a cough caused by taking certain blood pressure medications. You stop the cough by changing the medication.  Another example might be a cough caused by acid reflux. Controlling the reflux helps control the cough.  USE OF BRONCHODILATOR ("RESCUE") INHALERS: There is a risk from using your bronchodilator too frequently.  The risk is that over-reliance on a medication which only relaxes the muscles surrounding the breathing tubes can reduce the effectiveness of medications prescribed to reduce swelling and congestion of the tubes themselves.  Although you feel brief relief from the bronchodilator inhaler, your asthma may actually be worsening with the tubes becoming more swollen and filled with mucus.  This can delay other crucial treatments, such as oral steroid medications. If you need to use a bronchodilator inhaler daily, several times per day, you should discuss this with your provider.  There are probably better treatments that could be used to keep your asthma under control.     HOME CARE Only take medications as instructed by your medical team. Complete the entire course of an antibiotic. Drink plenty of fluids and get plenty of rest. Avoid close contacts especially the very young and the elderly Cover your mouth if you cough or cough into your sleeve. Always remember to wash your hands A steam or ultrasonic humidifier can help congestion.   GET HELP RIGHT AWAY IF: You develop worsening fever. You become short of breath You cough up blood. Your symptoms persist after you have completed your treatment plan MAKE SURE YOU  Understand these instructions. Will watch your condition.  Will get help right away if you are not doing well or get worse.    Thank you for choosing an e-visit.  Your e-visit answers were reviewed by a board certified advanced clinical practitioner to complete your personal care plan. Depending upon the condition, your plan could have included both over the counter or prescription medications.  Please review your pharmacy choice. Make sure the pharmacy is open so you can  pick up prescription now. If there is a problem, you may contact your provider through [employer] and have the prescription routed to another pharmacy.  Your safety is important to [place]. If you have drug allergies check your prescription carefully.   For the next 24 hours you can use MyChart to ask questions about today's visit, request a non-urgent call back, or ask for a work or school excuse. You will get an email in the next two days asking about your experience. I hope that your e-visit has been valuable and will speed your recovery. Approximately 5 minutes was spent documenting and reviewing patient's chart.

## 2023-05-15 NOTE — Addendum Note (Signed)
Addended by: Jannifer Rodney A on: 05/15/2023 10:54 AM   Modules accepted: Level of Service

## 2023-05-20 ENCOUNTER — Encounter: Payer: Self-pay | Admitting: Family Medicine

## 2023-05-28 ENCOUNTER — Other Ambulatory Visit (HOSPITAL_COMMUNITY): Payer: Self-pay

## 2023-06-07 ENCOUNTER — Other Ambulatory Visit: Payer: Self-pay

## 2023-06-23 ENCOUNTER — Other Ambulatory Visit: Payer: Self-pay

## 2023-06-23 ENCOUNTER — Telehealth: Payer: 59 | Admitting: Physician Assistant

## 2023-06-23 DIAGNOSIS — N3000 Acute cystitis without hematuria: Secondary | ICD-10-CM | POA: Diagnosis not present

## 2023-06-23 MED ORDER — NITROFURANTOIN MONOHYD MACRO 100 MG PO CAPS
100.0000 mg | ORAL_CAPSULE | Freq: Two times a day (BID) | ORAL | 0 refills | Status: AC
  Filled 2023-06-23: qty 10, 5d supply, fill #0

## 2023-06-23 MED ORDER — FLUCONAZOLE 150 MG PO TABS
150.0000 mg | ORAL_TABLET | Freq: Every day | ORAL | 0 refills | Status: DC
  Filled 2023-06-23: qty 1, 1d supply, fill #0

## 2023-06-23 NOTE — Progress Notes (Signed)
E-Visit for Urinary Problems  We are sorry that you are not feeling well.  Here is how we plan to help!  Based on what you shared with me it looks like you most likely have a simple urinary tract infection.  A UTI (Urinary Tract Infection) is a bacterial infection of the bladder.  Most cases of urinary tract infections are simple to treat but a key part of your care is to encourage you to drink plenty of fluids and watch your symptoms carefully.  I have prescribed MacroBid 100 mg twice a day for 5 days.  Your symptoms should gradually improve. Call us if the burning in your urine worsens, you develop worsening fever, back pain or pelvic pain or if your symptoms do not resolve after completing the antibiotic.  I have also sent in Diflucan to take if you experience yeast infection symptoms like vaginal itching, burning, or increased discharge.   Urinary tract infections can be prevented by drinking plenty of water to keep your body hydrated.  Also be sure when you wipe, wipe from front to back and don't hold it in!  If possible, empty your bladder every 4 hours.  HOME CARE Drink plenty of fluids Compete the full course of the antibiotics even if the symptoms resolve Remember, when you need to go.go. Holding in your urine can increase the likelihood of getting a UTI! GET HELP RIGHT AWAY IF: You cannot urinate You get a high fever Worsening back pain occurs You see blood in your urine You feel sick to your stomach or throw up You feel like you are going to pass out  MAKE SURE YOU  Understand these instructions. Will watch your condition. Will get help right away if you are not doing well or get worse.   Thank you for choosing an e-visit.  Your e-visit answers were reviewed by a board certified advanced clinical practitioner to complete your personal care plan. Depending upon the condition, your plan could have included both over the counter or prescription medications.  Please  review your pharmacy choice. Make sure the pharmacy is open so you can pick up prescription now. If there is a problem, you may contact your provider through [employer] and have the prescription routed to another pharmacy.  Your safety is important to [place]. If you have drug allergies check your prescription carefully.   For the next 24 hours you can use MyChart to ask questions about today's visit, request a non-urgent call back, or ask for a work or school excuse. You will get an email in the next two days asking about your experience. I hope that your e-visit has been valuable and will speed your recovery.   I have spent 5 minutes in review of e-visit questionnaire, review and updating patient chart, medical decision making and response to patient.   Tylene Fantasia Ward, PA-C

## 2023-06-24 ENCOUNTER — Other Ambulatory Visit: Payer: Self-pay

## 2023-06-27 ENCOUNTER — Other Ambulatory Visit: Payer: Self-pay | Admitting: Family Medicine

## 2023-06-27 ENCOUNTER — Other Ambulatory Visit: Payer: Self-pay

## 2023-06-27 MED FILL — Citalopram Hydrobromide Tab 10 MG (Base Equiv): ORAL | 90 days supply | Qty: 90 | Fill #1 | Status: AC

## 2023-06-28 ENCOUNTER — Other Ambulatory Visit: Payer: Self-pay

## 2023-06-28 MED ORDER — TRAZODONE HCL 100 MG PO TABS
100.0000 mg | ORAL_TABLET | Freq: Every evening | ORAL | 1 refills | Status: DC | PRN
  Filled 2023-06-28: qty 90, 90d supply, fill #0
  Filled 2023-12-17: qty 90, 90d supply, fill #1

## 2023-08-15 ENCOUNTER — Other Ambulatory Visit: Payer: Self-pay

## 2023-08-15 MED ORDER — AMOXICILLIN 500 MG PO CAPS
500.0000 mg | ORAL_CAPSULE | Freq: Two times a day (BID) | ORAL | 0 refills | Status: DC
  Filled 2023-08-15: qty 20, 10d supply, fill #0

## 2023-09-17 DIAGNOSIS — H52223 Regular astigmatism, bilateral: Secondary | ICD-10-CM | POA: Diagnosis not present

## 2023-09-17 DIAGNOSIS — H5213 Myopia, bilateral: Secondary | ICD-10-CM | POA: Diagnosis not present

## 2023-09-26 ENCOUNTER — Other Ambulatory Visit: Payer: Self-pay | Admitting: Family Medicine

## 2023-09-26 ENCOUNTER — Other Ambulatory Visit: Payer: Self-pay

## 2023-09-26 MED FILL — Citalopram Hydrobromide Tab 10 MG (Base Equiv): ORAL | 90 days supply | Qty: 90 | Fill #0 | Status: AC

## 2023-09-27 ENCOUNTER — Other Ambulatory Visit: Payer: Self-pay

## 2023-11-07 ENCOUNTER — Telehealth: Payer: Self-pay | Admitting: Family Medicine

## 2023-11-07 ENCOUNTER — Ambulatory Visit: Payer: 59 | Admitting: Family Medicine

## 2023-11-07 NOTE — Telephone Encounter (Signed)
Patient rescheduled 12/05/23 with Dr Adriana Simas

## 2023-11-07 NOTE — Telephone Encounter (Signed)
Copied from CRM [id number]. Topic: Appointments - Appointment Cancel/Reschedule >> Nov 06, 2023  4:32 PM Loretta Ward B wrote: Patient/patient representative is calling to cancel or reschedule an appointment. Refer to attachments for appointment information.

## 2023-12-05 ENCOUNTER — Ambulatory Visit: Payer: Commercial Managed Care - PPO | Admitting: Family Medicine

## 2023-12-05 ENCOUNTER — Other Ambulatory Visit: Payer: Self-pay

## 2023-12-05 ENCOUNTER — Encounter: Payer: Self-pay | Admitting: Family Medicine

## 2023-12-05 ENCOUNTER — Ambulatory Visit: Payer: 59 | Admitting: Family Medicine

## 2023-12-05 VITALS — BP 121/80 | HR 83 | Temp 97.3°F | Ht 64.7 in | Wt 185.0 lb

## 2023-12-05 DIAGNOSIS — F419 Anxiety disorder, unspecified: Secondary | ICD-10-CM | POA: Diagnosis not present

## 2023-12-05 DIAGNOSIS — F32A Depression, unspecified: Secondary | ICD-10-CM | POA: Diagnosis not present

## 2023-12-05 MED ORDER — BUPROPION HCL ER (SR) 150 MG PO TB12
150.0000 mg | ORAL_TABLET | Freq: Two times a day (BID) | ORAL | 1 refills | Status: DC
  Filled 2023-12-05: qty 180, 90d supply, fill #0

## 2023-12-05 MED ORDER — BUPROPION HCL ER (XL) 150 MG PO TB24
150.0000 mg | ORAL_TABLET | Freq: Every day | ORAL | 1 refills | Status: DC
Start: 2023-12-05 — End: 2024-03-11
  Filled 2023-12-05: qty 90, 90d supply, fill #0

## 2023-12-05 NOTE — Patient Instructions (Signed)
 Medication as directed.  Follow up in 3 months.  - [facility].

## 2023-12-05 NOTE — Progress Notes (Signed)
 Subjective:  Patient ID: Loretta Ward, female    DOB: November 11, 1985  Age: 38 y.o. MRN: [id number]  CC:   Chief Complaint  Patient presents with   Follow-up    6 month f/u     HPI:  38 year old female presents for follow-up.  Patient states that she is still struggling regarding her depression.  States that she has a fair amount of bad moods.  She is in a new relationship and her boyfriend recently moved in.  She states that everything is going okay regarding current 50-50 custody with her ex-husband.  She states that work is stressful but is okay.  She states that she recently took Contrave and this seemed to help her mood.  She took this for weight loss.  Will discuss treatment options regarding her depression today.   Patient Active Problem List   Diagnosis Date Noted   Anxiety and depression 12/05/2023   History of gestational hypertension 05/07/2023    Social Hx   Social History   Socioeconomic History   Marital status: Married    Spouse name: Not on file   Number of children: Not on file   Years of education: Not on file   Highest education level: Not on file  Occupational History   Not on file  Tobacco Use   Smoking status: Never   Smokeless tobacco: Never  Substance and Sexual Activity   Alcohol use: No    Alcohol/week: 0.0 standard drinks of alcohol   Drug use: No   Sexual activity: Yes  Other Topics Concern   Not on file  Social History Narrative   Not on file   Social Drivers of Health   Financial Resource Strain: Not on file  Food Insecurity: Not on file  Transportation Needs: Not on file  Physical Activity: Not on file  Stress: Not on file  Social Connections: Not on file    Review of Systems Per HPI  Objective:  BP 121/80   Pulse 83   Temp (!) 97.3 F (36.3 C)   Ht 5' 4.7" (1.643 m)   Wt 185 lb (83.9 kg)   PF 97 L/min   BMI 31.07 kg/m      12/05/2023   10:07 AM 05/07/2023    8:50 AM 08/28/2022   10:28 AM  BP/Weight  Systolic BP  121 132 112  Diastolic BP 80 83 73  Wt. (Lbs) 185 183.2 194.6  BMI 31.07 kg/m2 30.77 kg/m2 31.89 kg/m2    Physical Exam Vitals and nursing note reviewed.  Constitutional:      General: She is not in acute distress.    Appearance: Normal appearance.  HENT:     Head: Normocephalic and atraumatic.  Cardiovascular:     Rate and Rhythm: Normal rate and regular rhythm.  Pulmonary:     Effort: Pulmonary effort is normal.     Breath sounds: Normal breath sounds. No wheezing, rhonchi or rales.  Neurological:     Mental Status: She is alert.  Psychiatric:        Mood and Affect: Mood normal.        Behavior: Behavior normal.     Lab Results  Component Value Date   WBC 10.8 05/07/2023   HGB 13.9 05/07/2023   HCT 40.8 05/07/2023   PLT 315 05/07/2023   GLUCOSE 85 05/07/2023   CHOL 132 05/07/2023   TRIG 104 05/07/2023   HDL 60 05/07/2023   LDLCALC 53 05/07/2023   ALT 21 05/07/2023  AST 19 05/07/2023   NA 140 05/07/2023   K 4.1 05/07/2023   CL 104 05/07/2023   CREATININE 0.80 05/07/2023   BUN 11 05/07/2023   CO2 23 05/07/2023   TSH 1.300 05/07/2023     Assessment & Plan:  Anxiety and depression Assessment & Plan: Depression uncontrolled/worsening.  PHQ-9 score of 12.  After discussion with patient, adding Wellbutrin.  Continue Celexa.  Follow-up in 3 months.  Orders: -     buPROPion HCl ER (XL); Take 1 tablet (150 mg total) by mouth daily.  Dispense: 90 tablet; Refill: 1    Follow-up:  Return in about 3 months (around 03/03/2024).  Everlene Other DO [facility]

## 2023-12-05 NOTE — Assessment & Plan Note (Signed)
 Depression uncontrolled/worsening.  PHQ-9 score of 12.  After discussion with patient, adding Wellbutrin.  Continue Celexa.  Follow-up in 3 months.

## 2023-12-06 ENCOUNTER — Other Ambulatory Visit: Payer: Self-pay

## 2023-12-06 MED ORDER — AMOXICILLIN-POT CLAVULANATE 875-125 MG PO TABS
1.0000 | ORAL_TABLET | Freq: Two times a day (BID) | ORAL | 0 refills | Status: DC
  Filled 2023-12-06: qty 14, 7d supply, fill #0

## 2023-12-11 ENCOUNTER — Other Ambulatory Visit: Payer: Self-pay

## 2023-12-11 ENCOUNTER — Telehealth: Payer: Commercial Managed Care - PPO | Admitting: Physician Assistant

## 2023-12-11 DIAGNOSIS — J208 Acute bronchitis due to other specified organisms: Secondary | ICD-10-CM | POA: Diagnosis not present

## 2023-12-11 MED ORDER — PREDNISONE 10 MG PO TABS
ORAL_TABLET | ORAL | 0 refills | Status: AC
  Filled 2023-12-11: qty 21, 6d supply, fill #0

## 2023-12-11 NOTE — Progress Notes (Signed)
 E-Visit for Cough   We are sorry that you are not feeling well.  Here is how we plan to help!  Based on your presentation I believe you most likely have A cough due to a virus.  This is called viral bronchitis and is best treated by rest, plenty of fluids and control of the cough.  You may use Ibuprofen or Tylenol as directed to help your symptoms.     In addition you may use A non-prescription cough medication called Mucinex DM: take 2 tablets every 12 hours.  Prednisone 10 mg daily for 6 days (see taper instructions below)  Directions for 6 day taper: Day 1: 2 tablets before breakfast, 1 after both lunch & dinner and 2 at bedtime Day 2: 1 tab before breakfast, 1 after both lunch & dinner and 2 at bedtime Day 3: 1 tab at each meal & 1 at bedtime Day 4: 1 tab at breakfast, 1 at lunch, 1 at bedtime Day 5: 1 tab at breakfast & 1 tab at bedtime Day 6: 1 tab at breakfast  From your responses in the eVisit questionnaire you describe inflammation in the upper respiratory tract which is causing a significant cough.  This is commonly called Bronchitis and has four common causes:   Allergies Viral Infections Acid Reflux Bacterial Infection Allergies, viruses and acid reflux are treated by controlling symptoms or eliminating the cause. An example might be a cough caused by taking certain blood pressure medications. You stop the cough by changing the medication. Another example might be a cough caused by acid reflux. Controlling the reflux helps control the cough.  USE OF BRONCHODILATOR ("RESCUE") INHALERS: There is a risk from using your bronchodilator too frequently.  The risk is that over-reliance on a medication which only relaxes the muscles surrounding the breathing tubes can reduce the effectiveness of medications prescribed to reduce swelling and congestion of the tubes themselves.  Although you feel brief relief from the bronchodilator inhaler, your asthma may actually be worsening with the  tubes becoming more swollen and filled with mucus.  This can delay other crucial treatments, such as oral steroid medications. If you need to use a bronchodilator inhaler daily, several times per day, you should discuss this with your provider.  There are probably better treatments that could be used to keep your asthma under control.     HOME CARE Only take medications as instructed by your medical team. Complete the entire course of an antibiotic. Drink plenty of fluids and get plenty of rest. Avoid close contacts especially the very young and the elderly Cover your mouth if you cough or cough into your sleeve. Always remember to wash your hands A steam or ultrasonic humidifier can help congestion.   GET HELP RIGHT AWAY IF: You develop worsening fever. You become short of breath You cough up blood. Your symptoms persist after you have completed your treatment plan MAKE SURE YOU  Understand these instructions. Will watch your condition. Will get help right away if you are not doing well or get worse.    Thank you for choosing an e-visit.  Your e-visit answers were reviewed by a board certified advanced clinical practitioner to complete your personal care plan. Depending upon the condition, your plan could have included both over the counter or prescription medications.  Please review your pharmacy choice. Make sure the pharmacy is open so you can pick up prescription now. If there is a problem, you may contact your provider through [employer]  and have the prescription routed to another pharmacy.  Your safety is important to [place]. If you have drug allergies check your prescription carefully.   For the next 24 hours you can use MyChart to ask questions about today's visit, request a non-urgent call back, or ask for a work or school excuse. You will get an email in the next two days asking about your experience. I hope that your e-visit has been valuable and will speed your  recovery.   I have spent 5 minutes in review of e-visit questionnaire, review and updating patient chart, medical decision making and response to patient.   Margaretann Loveless, PA-C

## 2023-12-23 ENCOUNTER — Other Ambulatory Visit: Payer: Self-pay | Admitting: Family Medicine

## 2023-12-23 ENCOUNTER — Encounter: Payer: Self-pay | Admitting: Family Medicine

## 2023-12-23 ENCOUNTER — Other Ambulatory Visit: Payer: Self-pay

## 2023-12-23 MED ORDER — CITALOPRAM HYDROBROMIDE 20 MG PO TABS
20.0000 mg | ORAL_TABLET | Freq: Every day | ORAL | 3 refills | Status: AC
  Filled 2023-12-23: qty 90, 90d supply, fill #0
  Filled 2024-03-17: qty 90, 90d supply, fill #1
  Filled 2024-06-16: qty 90, 90d supply, fill #2
  Filled 2024-09-30: qty 90, 90d supply, fill #3

## 2024-01-02 DIAGNOSIS — Z6831 Body mass index (BMI) 31.0-31.9, adult: Secondary | ICD-10-CM | POA: Diagnosis not present

## 2024-01-02 DIAGNOSIS — Z01419 Encounter for gynecological examination (general) (routine) without abnormal findings: Secondary | ICD-10-CM | POA: Diagnosis not present

## 2024-03-04 ENCOUNTER — Ambulatory Visit: Payer: Commercial Managed Care - PPO | Admitting: Family Medicine

## 2024-03-11 ENCOUNTER — Encounter: Payer: Self-pay | Admitting: Family Medicine

## 2024-03-11 ENCOUNTER — Ambulatory Visit: Admitting: Family Medicine

## 2024-03-11 ENCOUNTER — Other Ambulatory Visit: Payer: Self-pay

## 2024-03-11 VITALS — BP 111/74 | HR 58 | Temp 98.4°F | Ht 64.7 in | Wt 190.0 lb

## 2024-03-11 DIAGNOSIS — F32A Depression, unspecified: Secondary | ICD-10-CM | POA: Diagnosis not present

## 2024-03-11 DIAGNOSIS — F419 Anxiety disorder, unspecified: Secondary | ICD-10-CM

## 2024-03-11 MED ORDER — BUPROPION HCL 75 MG PO TABS
75.0000 mg | ORAL_TABLET | Freq: Every day | ORAL | 0 refills | Status: DC
  Filled 2024-03-11: qty 30, 30d supply, fill #0

## 2024-03-11 NOTE — Assessment & Plan Note (Signed)
 Trying Wellbutrin  again at a lower dose.

## 2024-03-11 NOTE — Progress Notes (Signed)
 Subjective:  Patient ID: Loretta Ward, female    DOB: 05-15-1986  Age: 38 y.o. MRN: [id number]  CC:   Chief Complaint  Patient presents with   anxiety and depression     Pt voices no concerns     HPI:  38 year old female presents for follow-up.  Patient states that she is doing okay.  Not having any significant anxiety.  Does report ongoing depression.  Had chest pain secondary to Wellbutrin .  He subsequently stopped.  Celexa  was increased.  Patient reports ongoing fatigue.  She states that she walks and tries to stay active but this does not seem to help.  She gets plenty of rest although she has to use trazodone  to help her sleep.  She works day shift in the ER.  She reports that she often snacks and overeats particular when she is bored.  Patient overall is fatigued and states that she felt better when she was on Wellbutrin  but did not tolerate.  Will discuss today.  Patient Active Problem List   Diagnosis Date Noted   Anxiety and depression 12/05/2023   History of gestational hypertension 05/07/2023    Social Hx   Social History   Socioeconomic History   Marital status: Married    Spouse name: Not on file   Number of children: Not on file   Years of education: Not on file   Highest education level: Not on file  Occupational History   Not on file  Tobacco Use   Smoking status: Never   Smokeless tobacco: Never  Substance and Sexual Activity   Alcohol use: No    Alcohol/week: 0.0 standard drinks of alcohol   Drug use: No   Sexual activity: Yes  Other Topics Concern   Not on file  Social History Narrative   Not on file   Social Drivers of Health   Financial Resource Strain: Not on file  Food Insecurity: Not on file  Transportation Needs: Not on file  Physical Activity: Not on file  Stress: Not on file  Social Connections: Not on file    Review of Systems Per HPI  Objective:  BP 111/74   Pulse (!) 58   Temp 98.4 F (36.9 C)   Ht 5' 4.7" (1.643 m)    Wt 190 lb (86.2 kg)   SpO2 100%   BMI 31.91 kg/m      03/11/2024    9:14 AM 12/05/2023   10:07 AM 05/07/2023    8:50 AM  BP/Weight  Systolic BP 111 121 125  Diastolic BP 74 80 83  Wt. (Lbs) 190 185 183.2  BMI 31.91 kg/m2 31.07 kg/m2 30.77 kg/m2    Physical Exam Vitals and nursing note reviewed.  Constitutional:      General: She is not in acute distress.    Appearance: Normal appearance.  HENT:     Head: Normocephalic and atraumatic.  Cardiovascular:     Rate and Rhythm: Normal rate and regular rhythm.  Pulmonary:     Effort: Pulmonary effort is normal.     Breath sounds: Normal breath sounds.  Neurological:     Mental Status: She is alert.  Psychiatric:        Mood and Affect: Mood normal.        Behavior: Behavior normal.     Lab Results  Component Value Date   WBC 10.8 05/07/2023   HGB 13.9 05/07/2023   HCT 40.8 05/07/2023   PLT 315 05/07/2023   GLUCOSE 85 05/07/2023  CHOL 132 05/07/2023   TRIG 104 05/07/2023   HDL 60 05/07/2023   LDLCALC 53 05/07/2023   ALT 21 05/07/2023   AST 19 05/07/2023   NA 140 05/07/2023   K 4.1 05/07/2023   CL 104 05/07/2023   CREATININE 0.80 05/07/2023   BUN 11 05/07/2023   CO2 23 05/07/2023   TSH 1.300 05/07/2023     Assessment & Plan:  Anxiety and depression Assessment & Plan: Trying Wellbutrin  again at a lower dose.   Other orders -     buPROPion  HCl; Take 1 tablet (75 mg total) by mouth daily.  Dispense: 30 tablet; Refill: 0    Follow-up:  6 months  Kalee Mcclenathan Debrah Fan DO [facility]

## 2024-03-11 NOTE — Patient Instructions (Signed)
 Medication as prescribed.  Message me and let me know how you're doing.  Follow up in 6 months.

## 2024-04-03 ENCOUNTER — Other Ambulatory Visit: Payer: Self-pay

## 2024-04-03 ENCOUNTER — Other Ambulatory Visit: Payer: Self-pay | Admitting: Family Medicine

## 2024-04-03 ENCOUNTER — Encounter: Payer: Self-pay | Admitting: Family Medicine

## 2024-04-05 ENCOUNTER — Other Ambulatory Visit: Payer: Self-pay

## 2024-04-05 MED FILL — Trazodone HCl Tab 100 MG: ORAL | 90 days supply | Qty: 90 | Fill #0 | Status: AC

## 2024-04-05 MED FILL — Bupropion HCl Tab 75 MG: ORAL | 90 days supply | Qty: 90 | Fill #0 | Status: AC

## 2024-04-06 ENCOUNTER — Other Ambulatory Visit: Payer: Self-pay

## 2024-05-02 ENCOUNTER — Telehealth: Admitting: Nurse Practitioner

## 2024-05-02 DIAGNOSIS — R399 Unspecified symptoms and signs involving the genitourinary system: Secondary | ICD-10-CM

## 2024-05-02 MED ORDER — NITROFURANTOIN MONOHYD MACRO 100 MG PO CAPS: 100.0000 mg | ORAL_CAPSULE | Freq: Two times a day (BID) | ORAL | 0 refills | Status: AC

## 2024-05-02 NOTE — Progress Notes (Signed)

## 2024-05-02 NOTE — Progress Notes (Signed)
 I have spent 5 minutes in review of e-visit questionnaire, review and updating patient chart, medical decision making and response to patient.   Claiborne Rigg, NP

## 2024-05-09 ENCOUNTER — Telehealth: Admitting: Family Medicine

## 2024-05-09 DIAGNOSIS — R399 Unspecified symptoms and signs involving the genitourinary system: Secondary | ICD-10-CM

## 2024-05-09 NOTE — Progress Notes (Signed)
  Because Loretta Ward, I feel your condition warrants further evaluation and I recommend that you be seen in a face-to-face visit.   NOTE: There will be NO CHARGE for this E-Visit   If you are having a true medical emergency, please call 911.    For an urgent face to face visit, [place] has multiple urgent care centers for your convenience.  Click the link below for the full list of locations and hours, walk-in wait times, appointment scheduling options and driving directions:  Due to failure of treatment, you will need to go to the urgent care for a urinalysis and to have a culture sent off to ensure next antibiotic is the correct one.   Urgent Care - [place], [place], [place], [place], [place], [place]  [place]     Your MyChart E-visit questionnaire answers were reviewed by a board certified advanced clinical practitioner to complete your personal care plan based on your specific symptoms.    Thank you for using e-Visits.     have provided 5 minutes of non face to face time during this encounter for chart review and documentation.

## 2024-06-16 ENCOUNTER — Other Ambulatory Visit: Payer: Self-pay

## 2024-06-16 MED FILL — Bupropion HCl Tab 75 MG: ORAL | 90 days supply | Qty: 90 | Fill #1 | Status: CN

## 2024-06-22 MED FILL — Bupropion HCl Tab 75 MG: ORAL | 90 days supply | Qty: 90 | Fill #1 | Status: AC

## 2024-07-27 ENCOUNTER — Other Ambulatory Visit (HOSPITAL_BASED_OUTPATIENT_CLINIC_OR_DEPARTMENT_OTHER): Payer: Self-pay

## 2024-08-07 ENCOUNTER — Other Ambulatory Visit: Payer: Self-pay

## 2024-08-07 ENCOUNTER — Ambulatory Visit: Admitting: Nurse Practitioner

## 2024-08-07 ENCOUNTER — Telehealth: Payer: Self-pay

## 2024-08-07 ENCOUNTER — Encounter: Payer: Self-pay | Admitting: Family Medicine

## 2024-08-07 VITALS — BP 122/77 | HR 71 | Ht 64.0 in | Wt 195.0 lb

## 2024-08-07 DIAGNOSIS — R300 Dysuria: Secondary | ICD-10-CM

## 2024-08-07 DIAGNOSIS — N3000 Acute cystitis without hematuria: Secondary | ICD-10-CM

## 2024-08-07 DIAGNOSIS — R350 Frequency of micturition: Secondary | ICD-10-CM | POA: Diagnosis not present

## 2024-08-07 MED ORDER — CIPROFLOXACIN HCL 500 MG PO TABS
500.0000 mg | ORAL_TABLET | Freq: Two times a day (BID) | ORAL | 0 refills | Status: AC
  Filled 2024-08-07: qty 14, 7d supply, fill #0

## 2024-08-07 NOTE — Telephone Encounter (Signed)
 Office is in the process of addressing this CRM  Copied from CRM [id number]. Topic: Clinical - Medication Question >> Aug 07, 2024  9:09 AM Travis F wrote: Reason for CRM: Patient is calling in because she has a UTI and is requesting medication for it. requesting medication be sent to [facility]

## 2024-08-07 NOTE — Telephone Encounter (Signed)
 Cook, Jayce G, DO     08/07/24  9:42 AM Needs to bring in a urine sample

## 2024-08-10 ENCOUNTER — Encounter: Payer: Self-pay | Admitting: Nurse Practitioner

## 2024-08-10 NOTE — Progress Notes (Signed)
 Subjective:    Patient ID: Loretta Ward, female    DOB: [birth year], 38 y.o.   MRN: [id number]  Urinary Tract Infection    Discussed the use of AI scribe software for clinical note transcription with the patient, who gave verbal consent to proceed.  History of Present Illness Loretta Ward is a 38 year old female with recurrent urinary tract infections who presents with persistent urinary tract infection symptoms.  She has been experiencing symptoms of a urinary tract infection that have persisted despite previous treatments. Initially, she was prescribed Macrobid  during an e-visit a couple of months ago, which did not alleviate her symptoms. She then obtained a prescription for Keflex  through [facility], which provided some relief, but her symptoms never completely resolved.  Her symptoms include dysuria, along with a constant feeling of urgency and frequency. No fever, urinary incontinence, back or flank pain, vaginal discharge, or pelvic pain. 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  She has a history of recurrent UTIs, occurring approximately every six to nine months. She reports that this episode has been continuous for several weeks. No concerns about sexually transmitted diseases and reports having the same sexual partner.  No chest pain, shortness of breath, coughing, wheezing, or hematuria, although she notes that her urine appears slightly cloudy. No fever.    Social History   Tobacco Use   Smoking status: Never   Smokeless tobacco: Never  Substance Use Topics   Alcohol use: No    Alcohol/week: 0.0 standard drinks of alcohol   Drug use: No        Objective:   Physical Exam Vitals and nursing note reviewed.  Constitutional:      General: She is not in acute distress. Cardiovascular:     Rate and Rhythm: Normal rate and regular rhythm.  Pulmonary:     Effort:  Pulmonary effort is normal.     Breath sounds: Normal breath sounds.  Abdominal:     General: There is no distension.     Palpations: Abdomen is soft.     Tenderness: There is no abdominal tenderness. There is no right CVA tenderness, left CVA tenderness, guarding or rebound.  Neurological:     Mental Status: She is alert and oriented to person, place, and time.  Psychiatric:        Mood and Affect: Mood normal.        Behavior: Behavior normal.        Thought Content: Thought content normal.    Today's Vitals   08/07/24 1546  BP: 122/77  Pulse: 71  Weight: 195 lb (88.5 kg)  Height: 5' 4 (1.626 m)   Body mass index is 33.47 kg/m.  UA: trace blood; nitrites neg; 1+ leukocytes      Assessment & Plan:  1. Dysuria - Urinalysis - ciprofloxacin (CIPRO) 500 MG tablet; Take 1 tablet (500 mg total) by mouth 2 (two) times daily for 10 days.  Dispense: 14 tablet; Refill: 0  2. Acute cystitis without hematuria (Primary) Previous treatments with Macrobid  and Keflex  were ineffective, suggesting possible antibiotic resistance. Urinalysis indicates possible UTI. Differential includes resistant organism or non-infectious cause if culture negative. - Order urine culture to identify organism and antibiotic sensitivity. - Prescribe Ciprofloxacin for empirical treatment due to previous resistance. - Advise monitoring for pyelonephritis symptoms and seek medical attention if she occurs. - Instruct to maintain adequate hydration. -  Urinalysis - ciprofloxacin (CIPRO) 500 MG tablet; Take 1 tablet (500 mg total) by mouth 2 (two) times daily for 10 days.  Dispense: 14 tablet; Refill: 0   Return if symptoms worsen or fail to improve.

## 2024-08-11 ENCOUNTER — Ambulatory Visit: Payer: Self-pay | Admitting: Nurse Practitioner

## 2024-08-11 LAB — URINALYSIS
Bilirubin, UA: NEGATIVE
Glucose, UA: NEGATIVE
Ketones, UA: NEGATIVE
Nitrite, UA: NEGATIVE
Protein,UA: NEGATIVE
Specific Gravity, UA: 1.025 (ref 1.005–1.030)
Urobilinogen, Ur: 0.2 mg/dL (ref 0.2–1.0)
pH, UA: 6 (ref 5.0–7.5)

## 2024-09-09 ENCOUNTER — Other Ambulatory Visit: Payer: Self-pay

## 2024-09-30 ENCOUNTER — Other Ambulatory Visit: Payer: Self-pay

## 2024-09-30 ENCOUNTER — Other Ambulatory Visit: Payer: Self-pay | Admitting: Family Medicine

## 2024-09-30 MED ORDER — BUPROPION HCL 75 MG PO TABS
75.0000 mg | ORAL_TABLET | Freq: Every day | ORAL | 1 refills | Status: AC
  Filled 2024-09-30: qty 90, 90d supply, fill #0

## 2024-09-30 MED FILL — Trazodone HCl Tab 100 MG: ORAL | 90 days supply | Qty: 90 | Fill #1 | Status: AC
